# Patient Record
Sex: Male | Born: 2000 | Hispanic: No | Marital: Single | Smoking: Never smoker
Health system: Southern US, Community
[De-identification: ages and names within clinical notes are randomized; demographics above are authoritative.]

## PROBLEM LIST (undated history)

## (undated) DIAGNOSIS — E559 Vitamin D deficiency, unspecified: Secondary | ICD-10-CM

## (undated) DIAGNOSIS — E039 Hypothyroidism, unspecified: Secondary | ICD-10-CM

## (undated) DIAGNOSIS — E781 Pure hyperglyceridemia: Secondary | ICD-10-CM

---
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
## 2021-03-29 ENCOUNTER — Encounter (HOSPITAL_COMMUNITY): Payer: Self-pay | Admitting: Emergency Medicine

## 2021-03-29 ENCOUNTER — Emergency Department (HOSPITAL_COMMUNITY)
Admission: EM | Admit: 2021-03-29 | Discharge: 2021-03-29 | Disposition: A | Discharge status: Home / Self Care | Payer: Medicaid Other | Attending: Emergency Medicine | Admitting: Emergency Medicine

## 2021-03-29 ENCOUNTER — Other Ambulatory Visit: Payer: Self-pay

## 2021-03-29 DIAGNOSIS — H66001 Acute suppurative otitis media without spontaneous rupture of ear drum, right ear: Secondary | ICD-10-CM | POA: Insufficient documentation

## 2021-03-29 DIAGNOSIS — E039 Hypothyroidism, unspecified: Secondary | ICD-10-CM | POA: Insufficient documentation

## 2021-03-29 DIAGNOSIS — Z79899 Other long term (current) drug therapy: Secondary | ICD-10-CM | POA: Diagnosis not present

## 2021-03-29 DIAGNOSIS — H9201 Otalgia, right ear: Secondary | ICD-10-CM | POA: Diagnosis present

## 2021-03-29 MED ORDER — AMOXICILLIN 500 MG PO CAPS: 500.0000 mg | ORAL_CAPSULE | Freq: Three times a day (TID) | ORAL | 0 refills | Status: DC

## 2021-03-29 MED ORDER — OXYCODONE-ACETAMINOPHEN 5-325 MG PO TABS
1.0000 | ORAL_TABLET | Freq: Once | ORAL | Status: AC
  Administered 2021-03-29: 1 via ORAL
  Filled 2021-03-29: qty 1

## 2021-03-29 MED ORDER — AMOXICILLIN 500 MG PO CAPS
1000.0000 mg | ORAL_CAPSULE | Freq: Once | ORAL | Status: AC
  Administered 2021-03-29: 1000 mg via ORAL
  Filled 2021-03-29: qty 2

## 2021-03-29 NOTE — ED Provider Notes (Signed)
[facility] EMERGENCY DEPARTMENT Provider Note   CSN: [id number] Arrival date & time: 03/29/21  0308     History Chief Complaint  Patient presents with   Otalgia    Gary Maynard is a 20 y.o. male presents to the emergency department with complaints of right ear pain.  Patient states he saw his primary care yesterday and they cleaned wax from both ears.  He reports he felt fine at that time but several hours ago he developed severe right ear pain.  Has taken ibuprofen 600 mg without relief.  Denies fevers or chills.  Denies nasal congestion or cough.  No specific aggravating or alleviating factors.  The history is provided by the patient. No language interpreter was used.      History reviewed. No pertinent past medical history.  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 No pertinent surgical history.     Family History  Problem Relation Age of Onset   Diabetes Father    Kidney disease Father    Diabetes Maternal Grandmother    Diabetes Maternal Grandfather     Social History   Tobacco Use   Smoking status: Never   Smokeless tobacco: Never  Substance Use Topics   Alcohol use: Never   Drug use: Never    Home Medications Prior to Admission medications   Medication Sig Start Date End Date Taking? Authorizing Provider  amoxicillin (AMOXIL) 500 MG capsule Take 1 capsule (500 mg total) by mouth 3 (three) times daily. 03/29/21  Yes Basha Krygier, Dahlia Client, PA-C  ergocalciferol (VITAMIN D2) 50000 units capsule Take 1  capsule (50,000 Units total) by mouth once a week. 03/18/17   Dessa Phi, MD  levothyroxine (SYNTHROID, LEVOTHROID) 100 MCG tablet Take 1 tablet (100 mcg total) by mouth daily. 01/12/17 01/12/18  Dessa Phi, MD  Omega-3 Fatty Acids (FISH OIL) 1000 MG CAPS Take 1 capsule (1,000 mg total) by mouth 2 (two) times daily. 09/03/16   Dessa Phi, MD    Allergies    Patient has no known allergies.  Review of Systems   Review of Systems  Constitutional:  Negative for chills and fever.  HENT:  Positive for congestion and ear pain.   Eyes:  Negative for visual disturbance.  Respiratory:  Negative for cough and shortness of breath.   Cardiovascular:  Negative for chest pain.  Gastrointestinal:  Negative for abdominal pain.   Physical Exam Updated Vital Signs BP (!) 122/97   Pulse 90   Temp 98.1 F (36.7 C) (Oral)   Resp 18   SpO2 99%   Physical Exam Vitals and nursing note reviewed.  Constitutional:      General: He is not in acute distress.    Appearance: He is well-developed. He is not ill-appearing.  HENT:     Head: Normocephalic.     Jaw: There is normal jaw occlusion.     Right Ear: No middle ear effusion. Tympanic membrane is not injected, erythematous, retracted or bulging.     Left Ear: A middle ear effusion is present. Tympanic membrane is injected, erythematous and bulging.     Nose:  Nose normal. No congestion or rhinorrhea.     Mouth/Throat:     Mouth: Mucous membranes are moist.     Tongue: No lesions.     Palate: No mass.     Pharynx: Oropharynx is clear.  Eyes:     General: No scleral icterus.    Conjunctiva/sclera: Conjunctivae normal.  Cardiovascular:     Rate and Rhythm: Normal rate.  Pulmonary:     Effort: Pulmonary effort is normal.  Abdominal:     General: There is no distension.  Musculoskeletal:        General: Normal range of motion.     Cervical back: Normal range of motion.  Skin:    General: Skin is warm and dry.  Neurological:     Mental  Status: He is alert.  Psychiatric:        Mood and Affect: Mood normal.    ED Results / Procedures / Treatments   Labs (all labs ordered are listed, but only abnormal results are displayed) Labs Reviewed - No data to display  EKG None  Radiology No results found.  Procedures Procedures   Medications Ordered in ED Medications  amoxicillin (AMOXIL) capsule 1,000 mg (1,000 mg Oral Given 03/29/21 0327)  oxyCODONE-acetaminophen (PERCOCET/ROXICET) 5-325 MG per tablet 1 tablet (1 tablet Oral Given 03/29/21 0328)    ED Course  I have reviewed the triage vital signs and the nursing notes.  Pertinent labs & imaging results that were available during my care of the patient were reviewed by me and considered in my medical decision making (see chart for details).    MDM Rules/Calculators/A&P                           Patient presents with otalgia and exam consistent with acute otitis media. No concern for acute mastoiditis, meningitis.  No antibiotic use in the last month.  Patient discharged home with Amoxicillin.  Advised parents to call pediatrician today for follow-up.  I have also discussed reasons to return immediately to the ER.  Parent expresses understanding and agrees with plan.   Final Clinical Impression(s) / ED Diagnoses Final diagnoses:  Acute suppurative otitis media of right ear without spontaneous rupture of tympanic membrane, recurrence not specified    Rx / DC Orders ED Discharge Orders          Ordered    amoxicillin (AMOXIL) 500 MG capsule  3 times daily        03/29/21 0326             Katena Petitjean, Dahlia Client, PA-C 03/29/21 0339    Palumbo, April, MD 03/29/21 9179

## 2021-03-29 NOTE — ED Triage Notes (Signed)
Patient right ear pain onset yesterday , patient stated his MD cleaned it yesterday , no hearting loss or drainage .

## 2021-03-29 NOTE — Discharge Instructions (Addendum)
1. Medications: amoxicillin, usual home medications 2. Treatment: rest, drink plenty of fluids, do not stick anything in your ear 3. Follow Up: Please followup with your primary doctor in 2-3 days for discussion of your diagnoses and further evaluation after today's visit; if you do not have a primary care doctor use the resource guide provided to find one; Please return to the ER for new or worsening symptoms

## 2021-04-14 ENCOUNTER — Other Ambulatory Visit: Payer: Self-pay

## 2021-04-14 ENCOUNTER — Emergency Department (HOSPITAL_COMMUNITY): Payer: Medicaid Other

## 2021-04-14 ENCOUNTER — Encounter (HOSPITAL_COMMUNITY): Payer: Self-pay

## 2021-04-14 ENCOUNTER — Emergency Department (HOSPITAL_COMMUNITY)
Admission: EM | Admit: 2021-04-14 | Discharge: 2021-04-14 | Disposition: A | Discharge status: Home / Self Care | Payer: Medicaid Other | Attending: Emergency Medicine | Admitting: Emergency Medicine

## 2021-04-14 DIAGNOSIS — E039 Hypothyroidism, unspecified: Secondary | ICD-10-CM | POA: Diagnosis not present

## 2021-04-14 DIAGNOSIS — R519 Headache, unspecified: Secondary | ICD-10-CM | POA: Insufficient documentation

## 2021-04-14 DIAGNOSIS — Z79899 Other long term (current) drug therapy: Secondary | ICD-10-CM | POA: Diagnosis not present

## 2021-04-14 LAB — BASIC METABOLIC PANEL
Anion gap: 8 (ref 5–15)
BUN: 14 mg/dL (ref 6–20)
CO2: 26 mmol/L (ref 22–32)
Calcium: 9.5 mg/dL (ref 8.9–10.3)
Chloride: 107 mmol/L (ref 98–111)
Creatinine, Ser: 0.89 mg/dL (ref 0.61–1.24)
GFR, Estimated: >60 mL/min (ref >60)
Glucose, Bld: 108 mg/dL — ABNORMAL HIGH (ref 70–99)
Potassium: 4.6 mmol/L (ref 3.5–5.1)
Sodium: 141 mmol/L (ref 135–145)

## 2021-04-14 LAB — CBC WITH DIFFERENTIAL/PLATELET
Abs Immature Granulocytes: 0.02 10*3/uL (ref 0.00–0.07)
Basophils Absolute: 0 10*3/uL (ref 0.0–0.1)
Basophils Relative: 1 %
Eosinophils Absolute: 0.2 10*3/uL (ref 0.0–0.5)
Eosinophils Relative: 2 %
HCT: 41.5 % (ref 39.0–52.0)
Hemoglobin: 13.4 g/dL (ref 13.0–17.0)
Immature Granulocytes: 0 %
Lymphocytes Relative: 23 %
Lymphs Abs: 1.9 10*3/uL (ref 0.7–4.0)
MCH: 27.7 pg (ref 26.0–34.0)
MCHC: 32.3 g/dL (ref 30.0–36.0)
MCV: 85.7 fL (ref 80.0–100.0)
Monocytes Absolute: 0.4 10*3/uL (ref 0.1–1.0)
Monocytes Relative: 5 %
Neutro Abs: 5.6 10*3/uL (ref 1.7–7.7)
Neutrophils Relative %: 69 %
Platelets: 484 10*3/uL — ABNORMAL HIGH (ref 150–400)
RBC: 4.84 MIL/uL (ref 4.22–5.81)
RDW: 15.9 % — ABNORMAL HIGH (ref 11.5–15.5)
WBC: 8.1 10*3/uL (ref 4.0–10.5)
nRBC: 0 % (ref 0.0–0.2)

## 2021-04-14 NOTE — ED Provider Notes (Signed)
[facility] EMERGENCY DEPARTMENT Provider Note   CSN: [id number] Arrival date & time: 04/14/21  0814     History No chief complaint on file.   Levelle Edelen is a 20 y.o. male.  HPI Patient is a 20 year old male with a medical history as noted below.  He presents to the emergency department due to intermittent headaches for the past month.  States his headaches started along the occiput and radiate to the bilateral temporal regions.  States his pain is sharp/throbbing.  States this pain typically occurs daily in the morning and he will take ibuprofen which resolve his symptoms.  Of note, he was diagnosed with a right-sided otitis media on September 23 and discharged on amoxicillin which he states he did not complete "due to it making him feel weird".  He denies any visual changes, numbness, weakness, vomiting.    History reviewed. No pertinent past medical history.  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 No pertinent surgical history.     Family History  Problem Relation Age of Onset   Diabetes Father    Kidney disease Father    Diabetes Maternal Grandmother    Diabetes Maternal Grandfather     Social History   Tobacco Use   Smoking status: Never   Smokeless tobacco: Never  Substance Use Topics   Alcohol use: Never   Drug use: Never    Home Medications Prior to Admission medications   Medication Sig Start Date End Date Taking? Authorizing Provider  amoxicillin (AMOXIL) 500 MG capsule Take 1 capsule (500 mg  total) by mouth 3 (three) times daily. 03/29/21   Muthersbaugh, Dahlia Client, PA-C  ergocalciferol (VITAMIN D2) 50000 units capsule Take 1 capsule (50,000 Units total) by mouth once a week. 03/18/17   Dessa Phi, MD  levothyroxine (SYNTHROID, LEVOTHROID) 100 MCG tablet Take 1 tablet (100 mcg total) by mouth daily. 01/12/17 01/12/18  Dessa Phi, MD  Omega-3 Fatty Acids (FISH OIL) 1000 MG CAPS Take 1 capsule (1,000 mg total) by mouth 2 (two) times daily. 09/03/16   Dessa Phi, MD    Allergies    Patient has no known allergies.  Review of Systems   Review of Systems  All other systems reviewed and are negative. Ten systems reviewed and are negative for acute change, except as noted in the HPI.   Physical Exam Updated Vital Signs BP 121/70   Pulse (!) 59   Temp 98.4 F (36.9 C) (Oral)   Resp 18   SpO2 96%   Physical Exam Vitals and nursing note reviewed.  Constitutional:      General: He is not in acute distress.    Appearance: Normal appearance. He is normal weight. He is not ill-appearing, toxic-appearing or diaphoretic.  HENT:     Head: Normocephalic and atraumatic.     Right Ear: External ear normal. There is no impacted cerumen.     Left Ear: Tympanic membrane, ear canal and external ear normal. There is no impacted cerumen.     Ears:     Comments: Left ear, EAC, and TM appear normal.  Right external ear  appears normal.  Right EAC is erythematous.  Middle ear effusion noted on the right side.  Good bony landmarks and cone of light.  TM is nonbulging.  No tenderness appreciated with manipulation of the tragus.    Nose: Nose normal.     Mouth/Throat:     Mouth: Mucous membranes are moist.     Pharynx: Oropharynx is clear. No oropharyngeal exudate or posterior oropharyngeal erythema.  Eyes:     General: No scleral icterus.       Right eye: No discharge.        Left eye: No discharge.     Extraocular Movements: Extraocular movements intact.     Conjunctiva/sclera:  Conjunctivae normal.     Pupils: Pupils are equal, round, and reactive to light.     Comments: PERRL. EOMI.  Cardiovascular:     Rate and Rhythm: Normal rate and regular rhythm.     Pulses: Normal pulses.     Heart sounds: Normal heart sounds. No murmur heard.   No friction rub. No gallop.  Pulmonary:     Effort: Pulmonary effort is normal. No respiratory distress.     Breath sounds: Normal breath sounds. No stridor. No wheezing, rhonchi or rales.  Abdominal:     General: Abdomen is flat.     Tenderness: There is no abdominal tenderness.  Musculoskeletal:        General: Normal range of motion.     Cervical back: Normal range of motion and neck supple. No tenderness.  Skin:    General: Skin is warm and dry.  Neurological:     General: No focal deficit present.     Mental Status: He is alert and oriented to person, place, and time.     Comments: Patient is oriented to person, place, and time. Patient phonates in clear, complete, and coherent sentences. Negative arm drift. Finger to nose intact bilaterally with no visible signs of dysmetria. Strength is 5/5 in all four extremities. Distal sensation intact in all four extremities.  Psychiatric:        Mood and Affect: Mood normal.        Behavior: Behavior normal.   ED Results / Procedures / Treatments   Labs (all labs ordered are listed, but only abnormal results are displayed) Labs Reviewed  BASIC METABOLIC PANEL - Abnormal; Notable for the following components:      Result Value   Glucose, Bld 108 (*)    All other components within normal limits  CBC WITH DIFFERENTIAL/PLATELET - Abnormal; Notable for the following components:   RDW 15.9 (*)    Platelets 484 (*)    All other components within normal limits    EKG None  Radiology CT Head Wo Contrast  Result Date: 04/14/2021 CLINICAL DATA:  One week of headaches. EXAM: CT HEAD WITHOUT CONTRAST TECHNIQUE: Contiguous axial images were obtained from the base of the skull  through the vertex without intravenous contrast. COMPARISON:  None. FINDINGS: Brain: No evidence of acute infarction, hemorrhage, hydrocephalus, extra-axial collection or mass lesion/mass effect. Vascular: No hyperdense vessel is noted. Skull: Normal. Negative for fracture or focal lesion. Sinuses/Orbits: No acute finding. Other: None. IMPRESSION: No focal acute intracranial abnormality identified. Electronically Signed   By: Sherian Rein M.D.   On: 04/14/2021 08:52    Procedures Procedures   Medications Ordered in ED Medications - No data to display  ED Course  I have reviewed the triage vital signs and the nursing notes.  Pertinent labs & imaging  results that were available during my care of the patient were reviewed by me and considered in my medical decision making (see chart for details).    MDM Rules/Calculators/A&P                          Pt is a 20 y.o. male who presents to the emergency department due to intermittent headaches.  Labs: CBC with an RDW of 15.9 and platelets 44. BMP with a glucose of 108.  Imaging: CT scan of the head without contrast shows no focal acute intracranial abnormality.  I, Placido Sou, PA-C, personally reviewed and evaluated these images and lab results as part of my medical decision-making.  Unsure the source of the patient's headache.  He was recently diagnosed with a right ear infection and did not complete his antibiotics.  He does note that he is having no right ear pain at this time.  He does have a right ear effusion and mildly erythematous right EAC but right TM is not bulging.  Patient offered an additional course of antibiotics but he declined.  Physical exam is otherwise reassuring.  Neurological exam is benign.  Patient afebrile and not tachycardic.  Patient offered a migraine cocktail but he also declined.  Feel that he is stable for discharge at this time and he is agreeable.  We will give patient referral to neurology if he finds  that his symptoms are refractory.  We discussed return precautions.  His questions were answered and he was amicable at the time of discharge.  Note: Portions of this report may have been transcribed using voice recognition software. Every effort was made to ensure accuracy; however, inadvertent computerized transcription errors may be present.   Final Clinical Impression(s) / ED Diagnoses Final diagnoses:  Bad headache   Rx / DC Orders ED Discharge Orders     None        Placido Sou, PA-C 04/14/21 1532    Wynetta Fines, MD 04/15/21 2329

## 2021-04-14 NOTE — Discharge Instructions (Addendum)
I have given you a referral to [facility] neurology.  Their contact information is below.  Please give them a call and schedule a follow-up appointment regarding your headaches.  If you develop any new or worsening symptoms please come back to the emergency department.  It was a pleasure to meet you.

## 2021-04-14 NOTE — ED Provider Notes (Signed)
Emergency Medicine Provider Triage Evaluation Note  Gary Maynard , a 20 y.o. male  was evaluated in triage.  Pt complains of worsening headache x 1 week located behind head and radiating to front bilaterally. Patient rates headache 8/10 in severity, pulsatile in nature, waxes & wanes, with nausea and ringing in Left ear. Denies history of migraines in the past, CP, LOC.   Review of Systems  Positive: Nausea, sob, Left tinnitus, occasional dizziness  Negative: Vomiting, chest pain, LOC, confusion, visual changes  Physical Exam  There were no vitals taken for this visit. Gen:   Awake, no distress   Resp:  Normal effort  MSK:   Moves extremities without difficulty  Other:  Normal gait  Medical Decision Making  Medically screening exam initiated at 7:36 AM.  Appropriate orders placed.  Hakop Hernandez-Escamilla was informed that the remainder of the evaluation will be completed by another provider, this initial triage assessment does not replace that evaluation, and the importance of remaining in the ED until their evaluation is complete.     Jeannie Fend, PA-C 04/14/21 0750    Ernie Avena, MD 04/14/21 [phone]

## 2021-04-14 NOTE — ED Triage Notes (Signed)
Patient complains of 1 week of headache, pain radiating to frontal and occipital pain. Denies trauma. Patient alert and oriented, describes ringing to right ear

## 2021-09-19 ENCOUNTER — Emergency Department (HOSPITAL_COMMUNITY): Payer: Medicaid Other

## 2021-09-19 ENCOUNTER — Encounter (HOSPITAL_COMMUNITY): Payer: Self-pay | Admitting: Emergency Medicine

## 2021-09-19 ENCOUNTER — Emergency Department (HOSPITAL_COMMUNITY)
Admission: EM | Admit: 2021-09-19 | Discharge: 2021-09-19 | Disposition: A | Discharge status: Home / Self Care | Payer: Medicaid Other | Attending: Emergency Medicine | Admitting: Emergency Medicine

## 2021-09-19 ENCOUNTER — Other Ambulatory Visit: Payer: Self-pay

## 2021-09-19 DIAGNOSIS — M25522 Pain in left elbow: Secondary | ICD-10-CM | POA: Insufficient documentation

## 2021-09-19 DIAGNOSIS — Y92410 Unspecified street and highway as the place of occurrence of the external cause: Secondary | ICD-10-CM | POA: Diagnosis not present

## 2021-09-19 MED ORDER — IBUPROFEN 400 MG PO TABS
600.0000 mg | ORAL_TABLET | Freq: Once | ORAL | Status: AC
  Administered 2021-09-19: 600 mg via ORAL
  Filled 2021-09-19: qty 1

## 2021-09-19 NOTE — ED Notes (Signed)
RN reviewed discharge instructions w/ pt. Follow up and pain management reviewed, pt had no further questions. 
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
## 2021-11-16 ENCOUNTER — Emergency Department (HOSPITAL_COMMUNITY)
Admission: EM | Admit: 2021-11-16 | Discharge: 2021-11-16 | Disposition: A | Discharge status: Home / Self Care | Payer: Medicaid Other | Attending: Emergency Medicine | Admitting: Emergency Medicine

## 2021-11-16 ENCOUNTER — Encounter (HOSPITAL_COMMUNITY): Payer: Self-pay | Admitting: Emergency Medicine

## 2021-11-16 DIAGNOSIS — E860 Dehydration: Secondary | ICD-10-CM | POA: Insufficient documentation

## 2021-11-16 DIAGNOSIS — R55 Syncope and collapse: Secondary | ICD-10-CM | POA: Diagnosis present

## 2021-11-16 HISTORY — DX: Hypothyroidism, unspecified: E03.9

## 2021-11-16 HISTORY — DX: Pure hyperglyceridemia: E78.1

## 2021-11-16 HISTORY — DX: Morbid (severe) obesity due to excess calories: E66.01

## 2021-11-16 HISTORY — DX: Vitamin D deficiency, unspecified: E55.9

## 2021-11-16 LAB — URINALYSIS, ROUTINE W REFLEX MICROSCOPIC
Bacteria, UA: NONE SEEN
Bilirubin Urine: NEGATIVE
Glucose, UA: NEGATIVE mg/dL
Hgb urine dipstick: NEGATIVE
Ketones, ur: 5 mg/dL — AB
Leukocytes,Ua: NEGATIVE
Nitrite: NEGATIVE
Protein, ur: 30 mg/dL — AB
Specific Gravity, Urine: 1.03 (ref 1.005–1.030)
pH: 5 (ref 5.0–8.0)

## 2021-11-16 LAB — CBC WITH DIFFERENTIAL/PLATELET
Abs Immature Granulocytes: 0.07 10*3/uL (ref 0.00–0.07)
Basophils Absolute: 0 10*3/uL (ref 0.0–0.1)
Basophils Relative: 0 %
Eosinophils Absolute: 0.2 10*3/uL (ref 0.0–0.5)
Eosinophils Relative: 1 %
HCT: 45 % (ref 39.0–52.0)
Hemoglobin: 15.2 g/dL (ref 13.0–17.0)
Immature Granulocytes: 0 %
Lymphocytes Relative: 4 %
Lymphs Abs: 0.8 10*3/uL (ref 0.7–4.0)
MCH: 29 pg (ref 26.0–34.0)
MCHC: 33.8 g/dL (ref 30.0–36.0)
MCV: 85.7 fL (ref 80.0–100.0)
Monocytes Absolute: 1 10*3/uL (ref 0.1–1.0)
Monocytes Relative: 5 %
Neutro Abs: 16.2 10*3/uL — ABNORMAL HIGH (ref 1.7–7.7)
Neutrophils Relative %: 90 %
Platelets: 367 10*3/uL (ref 150–400)
RBC: 5.25 MIL/uL (ref 4.22–5.81)
RDW: 15.1 % (ref 11.5–15.5)
WBC: 18.2 10*3/uL — ABNORMAL HIGH (ref 4.0–10.5)
nRBC: 0 % (ref 0.0–0.2)

## 2021-11-16 LAB — LIPASE, BLOOD: Lipase: 32 U/L (ref 11–51)

## 2021-11-16 LAB — HEPATIC FUNCTION PANEL
ALT: 17 U/L (ref 0–44)
AST: 20 U/L (ref 15–41)
Albumin: 4.4 g/dL (ref 3.5–5.0)
Alkaline Phosphatase: 64 U/L (ref 38–126)
Bilirubin, Direct: 0.1 mg/dL (ref 0.0–0.2)
Indirect Bilirubin: 0.9 mg/dL (ref 0.3–0.9)
Total Bilirubin: 1 mg/dL (ref 0.3–1.2)
Total Protein: 7.7 g/dL (ref 6.5–8.1)

## 2021-11-16 LAB — BASIC METABOLIC PANEL
Anion gap: 9 (ref 5–15)
BUN: 19 mg/dL (ref 6–20)
CO2: 22 mmol/L (ref 22–32)
Calcium: 9.4 mg/dL (ref 8.9–10.3)
Chloride: 108 mmol/L (ref 98–111)
Creatinine, Ser: 0.85 mg/dL (ref 0.61–1.24)
GFR, Estimated: >60 mL/min (ref >60)
Glucose, Bld: 111 mg/dL — ABNORMAL HIGH (ref 70–99)
Potassium: 4 mmol/L (ref 3.5–5.1)
Sodium: 139 mmol/L (ref 135–145)

## 2021-11-16 LAB — CBG MONITORING, ED: Glucose-Capillary: 124 mg/dL — ABNORMAL HIGH (ref 70–99)

## 2021-11-16 MED ORDER — SODIUM CHLORIDE 0.9 % IV BOLUS
1000.0000 mL | Freq: Once | INTRAVENOUS | Status: AC
  Administered 2021-11-16: 1000 mL via INTRAVENOUS

## 2021-11-16 MED ORDER — METOCLOPRAMIDE HCL 5 MG/ML IJ SOLN
5.0000 mg | Freq: Once | INTRAMUSCULAR | Status: AC
Start: 2021-11-16 — End: 2021-11-16
  Administered 2021-11-16: 5 mg via INTRAVENOUS
  Filled 2021-11-16: qty 2

## 2021-11-16 NOTE — ED Triage Notes (Addendum)
Per EMS, patient from work, c/o syncopal episode on the way to bathroom. Reports having diarrhea today. BP 90/64 initially. Last BP 124/68. ?

## 2021-11-16 NOTE — ED Provider Notes (Addendum)
?[facility]-EMERGENCY DEPT ?Provider Note ? ? ?CSN: [id number] ?Arrival date & time: 11/16/21  1312 ? ?  ? ?History ? ?Chief Complaint  ?Patient presents with  ? Loss of Consciousness  ? ? ?Gary Maynard is a 21 y.o. male. ? ?21 year old male who presents after having syncopal event.  Patient states that he had nonbilious emesis followed by watery diarrhea.  No associated abdominal pain.  No fever.  States he got dizzy and lightheaded.  No associated chest pain.  Had brief syncopal event.  No reported seizure activity.  Feels better at this time. ? ? ?  ? ?Home Medications ?Prior to Admission medications   ?Medication Sig Start Date End Date Taking? Authorizing Provider  ?amoxicillin (AMOXIL) 500 MG capsule Take 1 capsule (500 mg total) by mouth 3 (three) times daily. 03/29/21   Muthersbaugh, Dahlia Client, PA-C  ?ergocalciferol (VITAMIN D2) 50000 units capsule Take 1 capsule (50,000 Units total) by mouth once a week. 03/18/17   Dessa Phi, MD  ?levothyroxine (SYNTHROID, LEVOTHROID) 100 MCG tablet Take 1 tablet (100 mcg total) by mouth daily. 01/12/17 01/12/18  Dessa Phi, MD  ?Omega-3 Fatty Acids (FISH OIL) 1000 MG CAPS Take 1 capsule (1,000 mg total) by mouth 2 (two) times daily. 09/03/16   Dessa Phi, MD  ?   ? ?Allergies    ?Patient has no known allergies.   ? ?Review of Systems   ?Review of Systems  ?All other systems reviewed and are negative. ? ?Physical Exam ?Updated Vital Signs ?BP 124/71 (BP Location: Left Arm)   Pulse 84   Temp 97.9 ?F (36.6 ?C) (Oral)   Resp (!) 24   Ht 1.829 m (6')   Wt 105 kg   SpO2 99%   BMI 31.39 kg/m?  ?Physical Exam ?Vitals and nursing note reviewed.  ?Constitutional:   ?   General: He is not in acute distress. ?   Appearance: Normal appearance. He is well-developed. He is not toxic-appearing.  ?HENT:  ?   Head: Normocephalic and atraumatic.  ?Eyes:  ?   General: Lids are normal.  ?   Conjunctiva/sclera: Conjunctivae normal.  ?   Pupils:  Pupils are equal, round, and reactive to light.  ?Neck:  ?   Thyroid: No thyroid mass.  ?   Trachea: No tracheal deviation.  ?Cardiovascular:  ?   Rate and Rhythm: Normal rate and regular rhythm.  ?   Heart sounds: Normal heart sounds. No murmur heard. ?  No gallop.  ?Pulmonary:  ?   Effort: Pulmonary effort is normal. No respiratory distress.  ?   Breath sounds: Normal breath sounds. No stridor. No decreased breath sounds, wheezing, rhonchi or rales.  ?Abdominal:  ?   General: There is no distension.  ?   Palpations: Abdomen is soft.  ?   Tenderness: There is no abdominal tenderness. There is no rebound.  ?Musculoskeletal:     ?   General: No tenderness. Normal range of motion.  ?   Cervical back: Normal range of motion and neck supple.  ?Skin: ?   General: Skin is warm and dry.  ?   Findings: No abrasion or rash.  ?Neurological:  ?   Mental Status: He is alert and oriented to person, place, and time. Mental status is at baseline.  ?   GCS: GCS eye subscore is 4. GCS verbal subscore is 5. GCS motor subscore is 6.  ?   Cranial Nerves: No cranial nerve deficit.  ?   Sensory:  No sensory deficit.  ?   Motor: Motor function is intact.  ?Psychiatric:     ?   Attention and Perception: Attention normal.     ?   Speech: Speech normal.     ?   Behavior: Behavior normal.  ? ? ?ED Results / Procedures / Treatments   ?Labs ?(all labs ordered are listed, but only abnormal results are displayed) ?Labs Reviewed  ?BASIC METABOLIC PANEL - Abnormal; Notable for the following components:  ?    Result Value  ? Glucose, Bld 111 (*)   ? All other components within normal limits  ?CBC WITH DIFFERENTIAL/PLATELET - Abnormal; Notable for the following components:  ? WBC 18.2 (*)   ? Neutro Abs 16.2 (*)   ? All other components within normal limits  ?CBG MONITORING, ED - Abnormal; Notable for the following components:  ? Glucose-Capillary 124 (*)   ? All other components within normal limits  ?HEPATIC FUNCTION PANEL  ?LIPASE, BLOOD   ?URINALYSIS, ROUTINE W REFLEX MICROSCOPIC  ?TSH  ?CBG MONITORING, ED  ?CBG MONITORING, ED  ? ? ?EKG ?EKG Interpretation ? ?Date/Time:  Saturday Nov 16 2021 13:36:29 EDT ?Ventricular Rate:  95 ?PR Interval:  156 ?QRS Duration: 108 ?QT Interval:  338 ?QTC Calculation: 424 ?R Axis:   79 ?Text Interpretation: Normal sinus rhythm Incomplete right bundle branch block Borderline ECG When compared with ECG of 14-Apr-2021 15:07, PREVIOUS ECG IS PRESENT Confirmed by Lorre Nick ([id number]) on 11/16/2021 6:04:55 PM ? ?Radiology ?No results found. ? ?Procedures ?Procedures  ? ? ?Medications Ordered in ED ?Medications  ?sodium chloride 0.9 % bolus 1,000 mL (has no administration in time range)  ?metoCLOPramide (REGLAN) injection 5 mg (has no administration in time range)  ?sodium chloride 0.9 % bolus 1,000 mL (1,000 mLs Intravenous New Bag/Given 11/16/21 1806)  ? ? ?ED Course/ Medical Decision Making/ A&P ?Clinical Course as of 11/16/21 1812  ?Sat Nov 16, 2021  ?1458 WBC(!): 18.2 [GL]  ?  ?Clinical Course User Index ?[GL] Loeffler, Finis Bud, PA-C  ? ?                        ?Medical Decision Making ?Amount and/or Complexity of Data Reviewed ?Labs: ordered. ? ?Risk ?Prescription drug management. ? ? ?Patient presents with what sounds like a viral gastroenteritis.  He has no abdominal discomfort.  Considered intra-abdominal pathologies but feel less likely.  Patient's urinalysis does show slightly increased ketones.  Patient slightly dehydrated and given IV fluids here as well as Reglan for his headache and feels much better.  He has not had any cough or shortness of breath. ? ?Patient likely had a vasovagal episode.  Patient feels better after treatment here will be discharged home.  Does not require admission at this time.  The patient did not require a translator for this encounter ? ? ? ? ? ? ? ?Final Clinical Impression(s) / ED Diagnoses ?Final diagnoses:  ?None  ? ? ?Rx / DC Orders ?ED Discharge Orders   ? ? None  ? ?  ? ? ?   ?Lorre Nick, MD ?11/16/21 1938 ? ?  ?Lorre Nick, MD ?11/16/21 1939 ? ?

## 2021-11-16 NOTE — ED Provider Triage Note (Signed)
Emergency Medicine Provider Triage Evaluation Note ? ?Gary Maynard , a 21 y.o. male  was evaluated in triage.  Pt complains of syncope.  Patient states that today he started not feeling well.  He was at work when he started feeling suddenly nauseous.  He went to go to the break room to lie down for a second and while he was walking he says that he thinks he passed out.  He does not remember being dizzy or anything like that when he passed out but he was feeling nauseous.  Nobody witnessed this event.  Does not think he has any injuries from the syncopal event.Gary Maynard  He also states that he has been having multiple episodes of diarrhea today.  He does have some abdominal discomfort in the epigastric region that feels aching.  He is concerned that he started a new medication last week called Synthroid.  He thinks that this may be causing his symptoms. ? ?Review of Systems  ?Positive: Syncope, nausea, diarrhea ?Negative:  ? ?Physical Exam  ?BP 133/63 (BP Location: Left Arm)   Pulse (!) 101   Temp 97.9 ?F (36.6 ?C) (Oral)   Resp 16   Ht 6' (1.829 m)   Wt 105 kg   SpO2 97%   BMI 31.39 kg/m?  ?Gen:   Awake, no distress   ?Resp:  Normal effort  ?MSK:   Moves extremities without difficulty  ?Other:   ? ?Medical Decision Making  ?Medically screening exam initiated at 1:50 PM.  Appropriate orders placed.  Gary Maynard was informed that the remainder of the evaluation will be completed by another provider, this initial triage assessment does not replace that evaluation, and the importance of remaining in the ED until their evaluation is complete. ? ? ?  ?Claudie Leach, PA-C ?11/16/21 1352 ? ?

## 2021-11-26 ENCOUNTER — Other Ambulatory Visit: Payer: Self-pay | Admitting: Nurse Practitioner

## 2021-11-26 DIAGNOSIS — R3129 Other microscopic hematuria: Secondary | ICD-10-CM

## 2021-11-29 ENCOUNTER — Ambulatory Visit
Admission: RE | Admit: 2021-11-29 | Discharge: 2021-11-29 | Disposition: A | Discharge status: Home / Self Care | Payer: Medicaid Other | Source: Ambulatory Visit | Attending: Nurse Practitioner | Admitting: Nurse Practitioner

## 2021-11-29 DIAGNOSIS — R3129 Other microscopic hematuria: Secondary | ICD-10-CM

## 2022-04-24 ENCOUNTER — Ambulatory Visit
Admission: RE | Admit: 2022-04-24 | Discharge: 2022-04-24 | Disposition: A | Discharge status: Home / Self Care | Payer: Medicaid Other | Source: Ambulatory Visit | Attending: Nurse Practitioner | Admitting: Nurse Practitioner

## 2022-04-24 ENCOUNTER — Other Ambulatory Visit: Payer: Self-pay | Admitting: Nurse Practitioner

## 2022-04-24 DIAGNOSIS — M545 Low back pain, unspecified: Secondary | ICD-10-CM

## 2022-05-08 ENCOUNTER — Ambulatory Visit: Payer: Medicaid Other | Attending: Nurse Practitioner | Admitting: Physical Therapy

## 2022-05-08 ENCOUNTER — Encounter: Payer: Self-pay | Admitting: Physical Therapy

## 2022-05-08 DIAGNOSIS — R293 Abnormal posture: Secondary | ICD-10-CM | POA: Insufficient documentation

## 2022-05-08 DIAGNOSIS — M6281 Muscle weakness (generalized): Secondary | ICD-10-CM | POA: Insufficient documentation

## 2022-05-08 DIAGNOSIS — M5459 Other low back pain: Secondary | ICD-10-CM | POA: Diagnosis present

## 2022-05-08 DIAGNOSIS — R29898 Other symptoms and signs involving the musculoskeletal system: Secondary | ICD-10-CM | POA: Insufficient documentation

## 2022-05-08 NOTE — Therapy (Signed)
OUTPATIENT PHYSICAL THERAPY THORACOLUMBAR EVALUATION   Patient Name: Gary Maynard MRN: [id number] DOB:[birth year], 21 y.o., male Today's Date: 05/08/2022   PT End of Session - 05/08/22 1412     Visit Number 1    Number of Visits 13    Date for PT Re-Evaluation 06/19/22    Authorization Type healthy blue    Authorization Time Period 05/08/22 to 07/03/22   extended in case of scheduling delay   PT Start Time 1317    PT Stop Time 1400    PT Time Calculation (min) 43 min    Activity Tolerance Patient tolerated treatment well    Behavior During Therapy [facility] for tasks assessed/performed             Past Medical History:  Diagnosis Date   Hypertriglyceridemia    Hypothyroidism    Morbid obesity ([place])    Vitamin D deficiency    History reviewed. No pertinent surgical history. 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/18/2015   Prediabetes 12/18/2015   Morbid obesity ([place]) 12/18/2015   Goiter 12/18/2015   Dyspepsia 12/18/2015   Elevated transaminase level 12/18/2015   Subclinical hypothyroidism 08/23/2015   Vitamin D deficiency 08/23/2015   Elevated hemoglobin A1c 08/23/2015   Family history of diabetic complications 78/29/5621    PCP: Dianna Rossetti NP   REFERRING PROVIDER: Dianna Rossetti, NP   REFERRING DIAG: M54.50 (ICD-10-CM) - Low back pain   Rationale for Evaluation and Treatment: Rehabilitation  THERAPY DIAG:  Other low back pain  Other symptoms and signs involving the musculoskeletal system  Muscle weakness (generalized)  Abnormal posture  ONSET DATE: 05/02/2022   SUBJECTIVE:                                                                                                                                                                                           SUBJECTIVE STATEMENT: My low back has been bothering me for awhile, one time I was doing a  workout where you put the weight on your upper back and squat down and I felt something pop in my low back. I was using 145# at the time. This was awhile ago, I tried to get different types of treatments and none worked. At first my pain got a little better but after that it stayed the same. No numbness or tingling Initially injury occurred October 2019.   PERTINENT HISTORY:   PAIN:  Are you having pain? Yes: NPRS scale: 4/10; can get to 9/10 at highest but takes maybe 10-15 minutes to calm down to 4/10 Pain location: middle  of low back and into R hip  Pain description: sometimes sharp Aggravating factors: sleeping on the couch Relieving factors: nothing   PRECAUTIONS: None  WEIGHT BEARING RESTRICTIONS: No  FALLS:  Has patient fallen in last 6 months? No  LIVING ENVIRONMENT: Lives with: lives with their family Lives in: House/apartment Stairs: 3 steps on the inside,2 steps no rails  Has following equipment at home: None  OCCUPATION: student   PLOF: Independent, Independent with basic ADLs, Independent with gait, and Independent with transfers  PATIENT GOALS: see if I can get feeling a little better    OBJECTIVE:   DIAGNOSTIC FINDINGS:  CLINICAL DATA:  Low back pain radiating to left buttock, no injury   EXAM: LUMBAR SPINE - 2-3 VIEW   COMPARISON:  08/01/2018   FINDINGS: There is no evidence of lumbar spine fracture. Alignment is normal. Disc spaces and vertebral body heights are preserved. Nonobstructive pattern of overlying bowel gas.   IMPRESSION: No fracture or dislocation of the lumbar spine. Disc spaces and vertebral body heights are preserved. Lumbar disc and neural foraminal pathology may be further evaluated by MRI if indicated by neurologically localizing signs and symptoms.  IMPRESSION: 1. No hydronephrosis. No sonographic evidence of pyelonephritis but ultrasound is not sensitive for pyelonephritis    PATIENT SURVEYS:  Oswestry 6/50  SCREENING  FOR RED FLAGS: Bowel or bladder incontinence: Yes: started 2 months ago   but later clarifies this is not true incontinence  Spinal tumors: No Cauda equina syndrome: Yes: started 2 months ago  Compression fracture: No Abdominal aneurysm: No  COGNITION: Overall cognitive status: Within functional limits for tasks assessed     SENSATION: WFL sacral dermatomes intact   MUSCLE LENGTH:  B hamstrings with severe limitation, B piriformis with severe limitation, B quads moderate limitation   POSTURE: rounded shoulders, forward head, increased thoracic kyphosis, and flexed trunk   PALPATION:  R lumbar paraspinals TTP, L glutes/piriformis TTP; lumbar PAs WNL down to L4 then very hypomobile at L5   LUMBAR ROM:   AROM eval  Flexion Moderate limitation, RFIS increases pain   Extension Mild limitation, REIS increase in pain   Right lateral flexion WNL  Left lateral flexion WNL   Right rotation Severe limitation   Left rotation Severe limitation    (Blank rows = not tested)  LOWER EXTREMITY ROM:     Active  Right eval Left eval  Hip flexion    Hip extension    Hip abduction    Hip adduction    Hip internal rotation    Hip external rotation    Knee flexion    Knee extension    Ankle dorsiflexion    Ankle plantarflexion    Ankle inversion    Ankle eversion     (Blank rows = not tested)  LOWER EXTREMITY MMT:    MMT Right eval Left eval  Hip flexion 4+ 4+  Hip extension 3+ 3+  Hip abduction 3 4+  Hip adduction    Hip internal rotation    Hip external rotation    Knee flexion 4+ 4+  Knee extension 5 5  Ankle dorsiflexion 5 5  Ankle plantarflexion    Ankle inversion    Ankle eversion     (Blank rows = not tested)  LUMBAR SPECIAL TESTS:    FUNCTIONAL TESTS:   Clonus (-)  No LLD noted   GAIT:  TODAY'S TREATMENT:  DATE:    05/08/22  TherEx  Lumbar rotation stretch 3x10 seconds B SKTC 3x10 seconds B  Figure 4 piriformis stretch 1x30 seconds B       PATIENT EDUCATION:  Education details: POC, HEP, exam findings Person educated: Patient Education method: Explanation, Verbal cues, and Handouts Education comprehension: verbalized understanding and returned demonstration  HOME EXERCISE PROGRAM: [id number]  ASSESSMENT:  CLINICAL IMPRESSION: Patient is a 21 y.o. M who was seen today for physical therapy evaluation and treatment for LBP. At first endorsed some signs/symptoms of possible cauda equina, related PT testing negative and he stated later he had misunderstood the terminology used- might benefit from medical interpreter here. Exam reveals significant limitations in lumbar and hip ROM, mild functional muscle weakness, poor biomechanics, and soft tissue impairment. Will benefit from skilled PT services to address functional impairments and reduce pain moving forward.   OBJECTIVE IMPAIRMENTS: decreased knowledge of condition, decreased mobility, difficulty walking, decreased ROM, decreased strength, hypomobility, increased fascial restrictions, increased muscle spasms, impaired flexibility, postural dysfunction, and pain.   ACTIVITY LIMITATIONS: carrying, lifting, bending, sitting, standing, squatting, sleeping, reach over head, and locomotion level  PARTICIPATION LIMITATIONS: cleaning, laundry, driving, shopping, community activity, occupation, and yard work  PERSONAL FACTORS: Age, Behavior pattern, Education, Fitness, and Past/current experiences are also affecting patient's functional outcome.   REHAB POTENTIAL: Good  CLINICAL DECISION MAKING: Stable/uncomplicated  EVALUATION COMPLEXITY: Low   GOALS: Goals reviewed with patient? No  SHORT TERM GOALS: Target date: 05/29/2022  Will be compliant with appropriate progressive HEP  Baseline: Goal status: INITIAL  2.  Pain to be no more than  6/10 at worst  Baseline:  Goal status: INITIAL  3.  Will demonstrate better understanding of general posture and biomechanics  Baseline:  Goal status: INITIAL  4.  Lumbar ROM to be no more than 25% limited on all planes  Baseline:  Goal status: INITIAL  5.  Hamstring and piriformis mm flexibility limitations to be no more than 25% impaired  Baseline:  Goal status: INITIAL    LONG TERM GOALS: Target date: 06/19/2022  MMT to improve by at least 1 grade in all weak groups  Baseline:  Goal status: INITIAL  2.  Pain to be no more than 4/10 at worst  Baseline:  Goal status: INITIAL  3.  Will demonstrate good biomechanics for weighted squats and deadlifts to prevent exacerbating condition with gym activities  Baseline:  Goal status: INITIAL  4.  Will be able to sit/stand/walk for unlimited periods of time without increase in pain to allow him to participate in all school and work based activities without increase in pain  Baseline:  Goal status: INITIAL    PLAN:  PT FREQUENCY: 2x/week  PT DURATION: 6 weeks  PLANNED INTERVENTIONS: Therapeutic exercises, Therapeutic activity, Neuromuscular re-education, Gait training, Patient/Family education, Self Care, Dry Needling, Manual therapy, and Re-evaluation.  PLAN FOR NEXT SESSION: work on lumbar and hip ROM/mobility, core strength, lifting biomechanics, manual/DN PRN    Cerita Rabelo U PT DPT PN2  05/08/2022, 2:14 PM   Check all possible CPT codes: 47096 - PT Re-evaluation, 97110- Therapeutic Exercise, [phone]- Neuro Re-education, [phone] - Gait Training, [phone] - Manual Therapy, [phone] - Therapeutic Activities, and [phone] - Self Care    Check all conditions that are expected to impact treatment: Musculoskeletal disorders and Social determinants of health   If treatment provided at initial evaluation, no treatment charged due to lack of authorization.

## 2022-05-13 ENCOUNTER — Encounter: Payer: Self-pay | Admitting: Physical Therapy

## 2022-05-13 ENCOUNTER — Ambulatory Visit: Payer: Medicaid Other | Admitting: Physical Therapy

## 2022-05-13 DIAGNOSIS — M5459 Other low back pain: Secondary | ICD-10-CM

## 2022-05-13 DIAGNOSIS — R293 Abnormal posture: Secondary | ICD-10-CM

## 2022-05-13 DIAGNOSIS — R29898 Other symptoms and signs involving the musculoskeletal system: Secondary | ICD-10-CM

## 2022-05-13 DIAGNOSIS — M6281 Muscle weakness (generalized): Secondary | ICD-10-CM

## 2022-05-13 NOTE — Therapy (Signed)
OUTPATIENT PHYSICAL THERAPY THORACOLUMBAR TREATMENT    Patient Name: Gary Maynard MRN: [id number] DOB:2000-11-27, 21 y.o., male Today's Date: 05/13/2022   PT End of Session - 05/13/22 1340     Visit Number 2    Number of Visits 13    Date for PT Re-Evaluation 06/19/22    Authorization Type healthy blue    Authorization Time Period 05/08/22 to 07/03/22    PT Start Time 1317    PT Stop Time 1355    PT Time Calculation (min) 38 min    Activity Tolerance Patient tolerated treatment well    Behavior During Therapy [facility] for tasks assessed/performed              Past Medical History:  Diagnosis Date   Hypertriglyceridemia    Hypothyroidism    Morbid obesity ([facility])    Vitamin D deficiency    History reviewed. No pertinent surgical history. 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/18/2015   Prediabetes 12/18/2015   Morbid obesity ([facility]) 12/18/2015   Goiter 12/18/2015   Dyspepsia 12/18/2015   Elevated transaminase level 12/18/2015   Subclinical hypothyroidism 08/23/2015   Vitamin D deficiency 08/23/2015   Elevated hemoglobin A1c 08/23/2015   Family history of diabetic complications 08/23/2015    PCP: Levonne Lapping NP   REFERRING PROVIDER: Levonne Lapping, NP   REFERRING DIAG: M54.50 (ICD-10-CM) - Low back pain   Rationale for Evaluation and Treatment: Rehabilitation  THERAPY DIAG:  Other low back pain  Other symptoms and signs involving the musculoskeletal system  Muscle weakness (generalized)  Abnormal posture  ONSET DATE: 05/02/2022   SUBJECTIVE:                                                                                                                                                                                           SUBJECTIVE STATEMENT:  Nothing new, I have a slight headache now back is about the same. Haven't tried exercises at home just  haven't had the time.   PERTINENT HISTORY:   PAIN:  Are you having pain? Yes: NPRS scale: 7/10 Pain location: middle of low back and into R hip  Pain description: aching and sharp  Aggravating factors: sleeping on the couch, stretching (not HEP, vague about what exact stretches make pain worse) Relieving factors: nothing    PRECAUTIONS: None  WEIGHT BEARING RESTRICTIONS: No  FALLS:  Has patient fallen in last 6 months? No  LIVING ENVIRONMENT: Lives with: lives with their family Lives in: House/apartment Stairs: 3 steps on the inside,2 steps no rails  Has following equipment  at home: None  OCCUPATION: student   PLOF: Independent, Independent with basic ADLs, Independent with gait, and Independent with transfers  PATIENT GOALS: see if I can get feeling a little better    OBJECTIVE:   DIAGNOSTIC FINDINGS:  CLINICAL DATA:  Low back pain radiating to left buttock, no injury   EXAM: LUMBAR SPINE - 2-3 VIEW   COMPARISON:  08/01/2018   FINDINGS: There is no evidence of lumbar spine fracture. Alignment is normal. Disc spaces and vertebral body heights are preserved. Nonobstructive pattern of overlying bowel gas.   IMPRESSION: No fracture or dislocation of the lumbar spine. Disc spaces and vertebral body heights are preserved. Lumbar disc and neural foraminal pathology may be further evaluated by MRI if indicated by neurologically localizing signs and symptoms.  IMPRESSION: 1. No hydronephrosis. No sonographic evidence of pyelonephritis but ultrasound is not sensitive for pyelonephritis    PATIENT SURVEYS:  Oswestry 6/50  SCREENING FOR RED FLAGS: Bowel or bladder incontinence: Yes: started 2 months ago   but later clarifies this is not true incontinence  Spinal tumors: No Cauda equina syndrome: Yes: started 2 months ago  Compression fracture: No Abdominal aneurysm: No  COGNITION: Overall cognitive status: Within functional limits for tasks  assessed     SENSATION: WFL sacral dermatomes intact   MUSCLE LENGTH:  B hamstrings with severe limitation, B piriformis with severe limitation, B quads moderate limitation   POSTURE: rounded shoulders, forward head, increased thoracic kyphosis, and flexed trunk   PALPATION:  R lumbar paraspinals TTP, L glutes/piriformis TTP; lumbar PAs WNL down to L4 then very hypomobile at L5   LUMBAR ROM:   AROM eval  Flexion Moderate limitation, RFIS increases pain   Extension Mild limitation, REIS increase in pain   Right lateral flexion WNL  Left lateral flexion WNL   Right rotation Severe limitation   Left rotation Severe limitation    (Blank rows = not tested)  LOWER EXTREMITY ROM:     Active  Right eval Left eval  Hip flexion    Hip extension    Hip abduction    Hip adduction    Hip internal rotation    Hip external rotation    Knee flexion    Knee extension    Ankle dorsiflexion    Ankle plantarflexion    Ankle inversion    Ankle eversion     (Blank rows = not tested)  LOWER EXTREMITY MMT:    MMT Right eval Left eval  Hip flexion 4+ 4+  Hip extension 3+ 3+  Hip abduction 3 4+  Hip adduction    Hip internal rotation    Hip external rotation    Knee flexion 4+ 4+  Knee extension 5 5  Ankle dorsiflexion 5 5  Ankle plantarflexion    Ankle inversion    Ankle eversion     (Blank rows = not tested)  LUMBAR SPECIAL TESTS:    FUNCTIONAL TESTS:   Clonus (-)  No LLD noted   GAIT:  TODAY'S TREATMENT:  DATE:   05/13/22  TherEx  SKTC 5x10 B  Lumbar rotation stretches x5 B 10 seconds holds  B HS stretches 3x30 seconds  B figure 4 piriformis stretches 3x30 seconds B  Bridges with hip ABD into green TB x10  Sidelying clams green TB x10 B  Walking bridges x10    05/08/22  TherEx  Lumbar rotation stretch 3x10 seconds B SKTC  3x10 seconds B  Figure 4 piriformis stretch 1x30 seconds B       PATIENT EDUCATION:  Education details: exercise form/purpose, HEP compliance  Person educated: Patient Education method: Explanation, Verbal cues, and Handouts Education comprehension: verbalized understanding and returned demonstration  HOME EXERCISE PROGRAM: [phone]  ASSESSMENT:  CLINICAL IMPRESSION:  Gary Maynard arrives today doing OK, hasn't had a chance to try his HEP just yet. Focused session on mm flexibility and core/hip strengthening today as per POC with progressions as tolerated. Proximal mm groups very weak and with poor endurance. Will plan to update HEP if compliance has improved next session.   OBJECTIVE IMPAIRMENTS: decreased knowledge of condition, decreased mobility, difficulty walking, decreased ROM, decreased strength, hypomobility, increased fascial restrictions, increased muscle spasms, impaired flexibility, postural dysfunction, and pain.   ACTIVITY LIMITATIONS: carrying, lifting, bending, sitting, standing, squatting, sleeping, reach over head, and locomotion level  PARTICIPATION LIMITATIONS: cleaning, laundry, driving, shopping, community activity, occupation, and yard work  PERSONAL FACTORS: Age, Behavior pattern, Education, Fitness, and Past/current experiences are also affecting patient's functional outcome.   REHAB POTENTIAL: Good  CLINICAL DECISION MAKING: Stable/uncomplicated  EVALUATION COMPLEXITY: Low   GOALS: Goals reviewed with patient? No  SHORT TERM GOALS: Target date: 05/29/2022  Will be compliant with appropriate progressive HEP  Baseline: Goal status: INITIAL  2.  Pain to be no more than 6/10 at worst  Baseline:  Goal status: INITIAL  3.  Will demonstrate better understanding of general posture and biomechanics  Baseline:  Goal status: INITIAL  4.  Lumbar ROM to be no more than 25% limited on all planes  Baseline:  Goal status: INITIAL  5.  Hamstring and  piriformis mm flexibility limitations to be no more than 25% impaired  Baseline:  Goal status: INITIAL    LONG TERM GOALS: Target date: 06/19/2022  MMT to improve by at least 1 grade in all weak groups  Baseline:  Goal status: INITIAL  2.  Pain to be no more than 4/10 at worst  Baseline:  Goal status: INITIAL  3.  Will demonstrate good biomechanics for weighted squats and deadlifts to prevent exacerbating condition with gym activities  Baseline:  Goal status: INITIAL  4.  Will be able to sit/stand/walk for unlimited periods of time without increase in pain to allow him to participate in all school and work based activities without increase in pain  Baseline:  Goal status: INITIAL    PLAN:  PT FREQUENCY: 2x/week  PT DURATION: 6 weeks  PLANNED INTERVENTIONS: Therapeutic exercises, Therapeutic activity, Neuromuscular re-education, Gait training, Patient/Family education, Self Care, Dry Needling, Manual therapy, and Re-evaluation.  PLAN FOR NEXT SESSION: work on lumbar and hip ROM/mobility, core strength, lifting biomechanics, manual/DN PRN    Gary Maynard U PT DPT PN2  05/13/2022, 1:56 PM   Check all possible CPT codes: 31540 - PT Re-evaluation, 97110- Therapeutic Exercise, [phone]- Neuro Re-education, [phone] - Gait Training, [phone] - Manual Therapy, [phone] - Therapeutic Activities, and [phone] - Self Care    Check all conditions that are expected to impact treatment: Musculoskeletal disorders and Social determinants of health  If treatment provided at initial evaluation, no treatment charged due to lack of authorization.

## 2022-05-20 ENCOUNTER — Telehealth: Payer: Self-pay | Admitting: Physical Therapy

## 2022-05-20 ENCOUNTER — Ambulatory Visit: Payer: Medicaid Other | Admitting: Physical Therapy

## 2022-05-20 NOTE — Telephone Encounter (Signed)
No-show for today's appointment. Called and spoke to pt, he apologizes as he forget. Reminded him of next appt date and time.  Lerry Liner PT DPT PN2

## 2022-05-27 ENCOUNTER — Encounter: Payer: Self-pay | Admitting: Physical Therapy

## 2022-05-27 ENCOUNTER — Ambulatory Visit: Payer: Medicaid Other | Admitting: Physical Therapy

## 2022-05-27 DIAGNOSIS — M5459 Other low back pain: Secondary | ICD-10-CM | POA: Diagnosis not present

## 2022-05-27 DIAGNOSIS — R29898 Other symptoms and signs involving the musculoskeletal system: Secondary | ICD-10-CM

## 2022-05-27 DIAGNOSIS — R293 Abnormal posture: Secondary | ICD-10-CM

## 2022-05-27 DIAGNOSIS — M6281 Muscle weakness (generalized): Secondary | ICD-10-CM

## 2022-05-27 NOTE — Therapy (Signed)
OUTPATIENT PHYSICAL THERAPY THORACOLUMBAR TREATMENT    Patient Name: Gary Maynard MRN: [id number] DOB:[birth year], 21 y.o., male Today's Date: 05/27/2022   PT End of Session - 05/27/22 1435     Visit Number 3    Number of Visits 13    Date for PT Re-Evaluation 06/19/22    Authorization Type healthy blue    Authorization Time Period 05/08/22 to 07/03/22    PT Start Time 1400    PT Stop Time 1441    PT Time Calculation (min) 41 min    Activity Tolerance Patient tolerated treatment well    Behavior During Therapy [facility] for tasks assessed/performed               Past Medical History:  Diagnosis Date   Hypertriglyceridemia    Hypothyroidism    Morbid obesity (HCC)    Vitamin D deficiency    History reviewed. No pertinent surgical history. 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/18/2015   Prediabetes 12/18/2015   Morbid obesity (HCC) 12/18/2015   Goiter 12/18/2015   Dyspepsia 12/18/2015   Elevated transaminase level 12/18/2015   Subclinical hypothyroidism 08/23/2015   Vitamin D deficiency 08/23/2015   Elevated hemoglobin A1c 08/23/2015   Family history of diabetic complications 08/23/2015    PCP: Levonne Lapping NP   REFERRING PROVIDER: Levonne Lapping, NP   REFERRING DIAG: M54.50 (ICD-10-CM) - Low back pain   Rationale for Evaluation and Treatment: Rehabilitation  THERAPY DIAG:  Other low back pain  Other symptoms and signs involving the musculoskeletal system  Muscle weakness (generalized)  Abnormal posture  ONSET DATE: 05/02/2022   SUBJECTIVE:                                                                                                                                                                                           SUBJECTIVE STATEMENT:  Things are feeling better since last time, pain comes and goes; not really any patterns to it. Have  been trying to do HEP 3x/week this is the only time I have free  PERTINENT HISTORY:   PAIN:  Are you having pain? Yes: NPRS scale: 6/10 Pain location: middle of low back and into R hip  Pain description: aching and sharp  Aggravating factors: laying down getting back up Relieving factors: nothing    PRECAUTIONS: None  WEIGHT BEARING RESTRICTIONS: No  FALLS:  Has patient fallen in last 6 months? No  LIVING ENVIRONMENT: Lives with: lives with their family Lives in: House/apartment Stairs: 3 steps on the inside,2 steps no rails  Has following equipment at  home: None  OCCUPATION: student   PLOF: Independent, Independent with basic ADLs, Independent with gait, and Independent with transfers  PATIENT GOALS: see if I can get feeling a little better    OBJECTIVE:   DIAGNOSTIC FINDINGS:  CLINICAL DATA:  Low back pain radiating to left buttock, no injury   EXAM: LUMBAR SPINE - 2-3 VIEW   COMPARISON:  08/01/2018   FINDINGS: There is no evidence of lumbar spine fracture. Alignment is normal. Disc spaces and vertebral body heights are preserved. Nonobstructive pattern of overlying bowel gas.   IMPRESSION: No fracture or dislocation of the lumbar spine. Disc spaces and vertebral body heights are preserved. Lumbar disc and neural foraminal pathology may be further evaluated by MRI if indicated by neurologically localizing signs and symptoms.  IMPRESSION: 1. No hydronephrosis. No sonographic evidence of pyelonephritis but ultrasound is not sensitive for pyelonephritis    PATIENT SURVEYS:  Oswestry 6/50  SCREENING FOR RED FLAGS: Bowel or bladder incontinence: Yes: started 2 months ago   but later clarifies this is not true incontinence  Spinal tumors: No Cauda equina syndrome: Yes: started 2 months ago  Compression fracture: No Abdominal aneurysm: No  COGNITION: Overall cognitive status: Within functional limits for tasks assessed     SENSATION: WFL sacral  dermatomes intact   MUSCLE LENGTH:  B hamstrings with severe limitation, B piriformis with severe limitation, B quads moderate limitation   POSTURE: rounded shoulders, forward head, increased thoracic kyphosis, and flexed trunk   PALPATION:  R lumbar paraspinals TTP, L glutes/piriformis TTP; lumbar PAs WNL down to L4 then very hypomobile at L5   LUMBAR ROM:   AROM eval 11/21  Flexion Moderate limitation, RFIS increases pain   Severe limitation   Extension Mild limitation, REIS increase in pain  WNL but painful   Right lateral flexion WNL   Left lateral flexion WNL    Right rotation Severe limitation  Severe limitation   Left rotation Severe limitation  Severe limitation    (Blank rows = not tested)  LOWER EXTREMITY ROM:     Active  Right eval Left eval  Hip flexion    Hip extension    Hip abduction    Hip adduction    Hip internal rotation    Hip external rotation    Knee flexion    Knee extension    Ankle dorsiflexion    Ankle plantarflexion    Ankle inversion    Ankle eversion     (Blank rows = not tested)  LOWER EXTREMITY MMT:    MMT Right eval Left eval 11/21 R 11/21 L   Hip flexion 4+ 4+    Hip extension 3+ 3+ 3 3  Hip abduction 3 4+ 4+ 4+  Hip adduction      Hip internal rotation      Hip external rotation      Knee flexion 4+ 4+    Knee extension 5 5    Ankle dorsiflexion 5 5    Ankle plantarflexion      Ankle inversion      Ankle eversion       (Blank rows = not tested)  LUMBAR SPECIAL TESTS:    FUNCTIONAL TESTS:   Clonus (-)  No LLD noted   GAIT:  TODAY'S TREATMENT:  DATE:   05/27/22  TherEx  DKTC 5x10 seconds  Lumbar rotation stretch with contralateral trunk rotation 5x10 seconds B HS stretches 3x30 seconds B Modified cat cow x20 at edge of table  (attempted regular cat cow, pt declined "I don't feel  comfortable doing it" and would not elaborate if it was due to pain or another reason) Attempted childs pose- unable to get correct form QL stretch 3x30 seconds EOB Single leg bridges x10 B Deadlifts- poor form even with max cues, kept rounding back and going to floor level instead of knee level Good mornings 15# KB partial range x10 (tends to round back after a point)       PATIENT EDUCATION:  Education details: exercise form/purpose Person educated: Patient Education method: Explanation, Verbal cues, and Handouts Education comprehension: verbalized understanding and returned demonstration  HOME EXERCISE PROGRAM: Access Code: [email or web address] URL: [email or web address] Date: 05/27/2022 Prepared by: Lerry Liner  Exercises - Supine Lower Trunk Rotation  - 2 x daily - 7 x weekly - 1 sets - 5 reps - 10 hold - Supine Single Knee to Chest Stretch  - 2 x daily - 7 x weekly - 1 sets - 5 reps - 10 hold - Supine Figure 4 Piriformis Stretch  - 2 x daily - 7 x weekly - 1 sets - 3 reps - 30 hold - Single Leg Bridge  - 2 x daily - 7 x weekly - 1 sets - 10 reps - 2 hold - Seated Hamstring Stretch  - 2 x daily - 7 x weekly - 1 sets - 3 reps - Seated Quadratus Lumborum Stretch in Chair  - 2 x daily - 7 x weekly - 1 sets - 3 reps - 30 hold   ASSESSMENT:  CLINICAL IMPRESSION:  Hudsen arrives today doing OK, checked STGs and otherwise continued working on lumbar/hip mobility primarily as he remains very tight. HEP compliance has improved but he does continue to have significant MSK compliants. Has very poor form with biomechanics/functional lifting, will really need to work on this. Will continue efforts.    OBJECTIVE IMPAIRMENTS: decreased knowledge of condition, decreased mobility, difficulty walking, decreased ROM, decreased strength, hypomobility, increased fascial restrictions, increased muscle spasms, impaired flexibility, postural dysfunction, and pain.   ACTIVITY LIMITATIONS:  carrying, lifting, bending, sitting, standing, squatting, sleeping, reach over head, and locomotion level  PARTICIPATION LIMITATIONS: cleaning, laundry, driving, shopping, community activity, occupation, and yard work  PERSONAL FACTORS: Age, Behavior pattern, Education, Fitness, and Past/current experiences are also affecting patient's functional outcome.   REHAB POTENTIAL: Good  CLINICAL DECISION MAKING: Stable/uncomplicated  EVALUATION COMPLEXITY: Low   GOALS: Goals reviewed with patient? No  SHORT TERM GOALS: Target date: 05/29/2022  Will be compliant with appropriate progressive HEP  Baseline: Goal status: IN PROGRESS 11/21- improving 3x/week now   2.  Pain to be no more than 6/10 at worst  Baseline:  Goal status: IN PROGRESS 11/21- 8/10 at worst   3.  Will demonstrate better understanding of general posture and biomechanics  Baseline:  Goal status: IN PROGRESS  4.  Lumbar ROM to be no more than 25% limited on all planes  Baseline:  Goal status: IN PROGRESS severe limitation in flexion and rotation   5.  Hamstring and piriformis mm flexibility limitations to be no more than 25% impaired  Baseline:  Goal status: IN PROGRESS 11/21- still very tight     LONG TERM GOALS: Target date: 06/19/2022  MMT to improve by at least 1 grade in all  weak groups  Baseline:  Goal status: INITIAL  2.  Pain to be no more than 4/10 at worst  Baseline:  Goal status: INITIAL  3.  Will demonstrate good biomechanics for weighted squats and deadlifts to prevent exacerbating condition with gym activities  Baseline:  Goal status: INITIAL  4.  Will be able to sit/stand/walk for unlimited periods of time without increase in pain to allow him to participate in all school and work based activities without increase in pain  Baseline:  Goal status: INITIAL    PLAN:  PT FREQUENCY: 2x/week  PT DURATION: 6 weeks  PLANNED INTERVENTIONS: Therapeutic exercises, Therapeutic activity,  Neuromuscular re-education, Gait training, Patient/Family education, Self Care, Dry Needling, Manual therapy, and Re-evaluation.  PLAN FOR NEXT SESSION: work on lumbar and hip ROM/mobility, core strength, lifting biomechanics, manual/DN PRN    Zalayah Pizzuto U PT DPT PN2  05/27/2022, 2:43 PM   Check all possible CPT codes: 55217 - PT Re-evaluation, 97110- Therapeutic Exercise, [phone]- Neuro Re-education, [phone] - Gait Training, [phone] - Manual Therapy, [phone] - Therapeutic Activities, and [phone] - Self Care    Check all conditions that are expected to impact treatment: Musculoskeletal disorders and Social determinants of health   If treatment provided at initial evaluation, no treatment charged due to lack of authorization.

## 2022-06-03 ENCOUNTER — Telehealth: Payer: Self-pay | Admitting: Physical Therapy

## 2022-06-03 ENCOUNTER — Ambulatory Visit: Payer: Medicaid Other | Admitting: Physical Therapy

## 2022-06-03 NOTE — Telephone Encounter (Signed)
No-show for today's appointment. Called and spoke to patient, he was unable to give a reason for missing today's appointment. Reminded him of date/time of next scheduled PT session.  Lerry Liner PT DPT PN2

## 2022-06-04 ENCOUNTER — Other Ambulatory Visit: Payer: Self-pay | Admitting: Neurology

## 2022-06-04 NOTE — Progress Notes (Deleted)
Referring:  Levonne Lapping, NP [phone],  [place] [address]  PCP: Levonne Lapping, NP  Neurology was asked to evaluate Loralie Champagne, a 21 year old male for a chief complaint of headaches.  Our recommendations of care will be communicated by shared medical record.    CC:  headaches  History provided from ***  HPI:  Medical co-morbidities: hypothyroidism, prediabetes  The patient presents for evaluation of headaches which began***  Headache History: Onset: Triggers: Aura: Location: Quality/Description: Associated Symptoms:  Photophobia:  Phonophobia:  Nausea: Vomiting: Allodynia: Other symptoms: Worse with activity?: Duration of headaches:  Headache days per month: *** Migraine days per month: *** Headache free days per month: ***  Current Treatment: Abortive ***  Preventative ***  Prior Therapies                                 Naproxen 500 mg PRN Imitrex 50 mg PRN Flexeril 5 mg PRN Lexapro 10 mg daily   LABS: ***  IMAGING:  [facility] 04/14/21: unremarkable  ***Imaging independently reviewed on June 04, 2022   Current Outpatient Medications on File Prior to Visit  Medication Sig Dispense Refill   amoxicillin (AMOXIL) 500 MG capsule Take 1 capsule (500 mg total) by mouth 3 (three) times daily. 21 capsule 0   ergocalciferol (VITAMIN D2) 50000 units capsule Take 1 capsule (50,000 Units total) by mouth once a week. 12 capsule 0   levothyroxine (SYNTHROID, LEVOTHROID) 100 MCG tablet Take 1 tablet (100 mcg total) by mouth daily. 30 tablet 11   Omega-3 Fatty Acids (FISH OIL) 1000 MG CAPS Take 1 capsule (1,000 mg total) by mouth 2 (two) times daily. 60 capsule 11   No current facility-administered medications on file prior to visit.     Allergies: No Known Allergies  Family History: Migraine or other headaches in the family:  *** Aneurysms in a first degree relative:  *** Brain tumors in the family:  *** Other  neurological illness in the family:   ***  Past Medical History: Past Medical History:  Diagnosis Date   Hypertriglyceridemia    Hypothyroidism    Morbid obesity (HCC)    Vitamin D deficiency     Past Surgical History No past surgical history on file.  Social History: Social History   Tobacco Use   Smoking status: Never   Smokeless tobacco: Never  Substance Use Topics   Alcohol use: Never   Drug use: Never   ***  ROS: Negative for fevers, chills. Positive for***. All other systems reviewed and negative unless stated otherwise in HPI.   Physical Exam:   Vital Signs: There were no vitals taken for this visit. GENERAL: well appearing,in no acute distress,alert SKIN:  Color, texture, turgor normal. No rashes or lesions HEAD:  Normocephalic/atraumatic. CV:  RRR RESP: Normal respiratory effort MSK: no tenderness to palpation over occiput, neck, or shoulders  NEUROLOGICAL: Mental Status: Alert, oriented to person, place and time,Follows commands Cranial Nerves: PERRL, visual fields intact to confrontation, extraocular movements intact, facial sensation intact, no facial droop or ptosis, hearing grossly intact, no dysarthria, palate elevate symmetrically, tongue protrudes midline, shoulder shrug intact and symmetric Motor: muscle strength 5/5 both upper and lower extremities,no drift, normal tone Reflexes: 2+ throughout Sensation: intact to light touch all 4 extremities Coordination: Finger-to- nose-finger intact bilaterally Gait: normal-based   IMPRESSION: ***  PLAN: ***   I spent a total of *** minutes  chart reviewing and counseling the patient. Headache education was done. Discussed treatment options including preventive and acute medications, natural supplements, and physical therapy. Discussed medication overuse headache and to limit use of acute treatments to no more than 2 days/week or 10 days/month. Discussed medication side effects, adverse reactions and  drug interactions. Written educational materials and patient instructions outlining all of the above were given.  Follow-up: ***   Ocie Doyne, MD 06/04/2022   11:51 AM

## 2022-06-05 ENCOUNTER — Encounter: Payer: Self-pay | Admitting: Psychiatry

## 2022-06-05 ENCOUNTER — Ambulatory Visit: Payer: Medicaid Other | Admitting: Psychiatry

## 2022-06-06 ENCOUNTER — Emergency Department (HOSPITAL_COMMUNITY)
Admission: EM | Admit: 2022-06-06 | Discharge: 2022-06-06 | Discharge status: Against Medical Advice | Payer: Medicaid Other | Attending: Emergency Medicine | Admitting: Emergency Medicine

## 2022-06-06 ENCOUNTER — Other Ambulatory Visit: Payer: Self-pay

## 2022-06-06 DIAGNOSIS — Z5321 Procedure and treatment not carried out due to patient leaving prior to being seen by health care provider: Secondary | ICD-10-CM | POA: Diagnosis not present

## 2022-06-06 DIAGNOSIS — H539 Unspecified visual disturbance: Secondary | ICD-10-CM | POA: Insufficient documentation

## 2022-06-06 DIAGNOSIS — R519 Headache, unspecified: Secondary | ICD-10-CM | POA: Insufficient documentation

## 2022-06-06 LAB — CBC WITH DIFFERENTIAL/PLATELET
Abs Immature Granulocytes: 0.02 10*3/uL (ref 0.00–0.07)
Basophils Absolute: 0 10*3/uL (ref 0.0–0.1)
Basophils Relative: 0 %
Eosinophils Absolute: 0.3 10*3/uL (ref 0.0–0.5)
Eosinophils Relative: 3 %
HCT: 42.4 % (ref 39.0–52.0)
Hemoglobin: 14.4 g/dL (ref 13.0–17.0)
Immature Granulocytes: 0 %
Lymphocytes Relative: 30 %
Lymphs Abs: 2.8 10*3/uL (ref 0.7–4.0)
MCH: 28.4 pg (ref 26.0–34.0)
MCHC: 34 g/dL (ref 30.0–36.0)
MCV: 83.6 fL (ref 80.0–100.0)
Monocytes Absolute: 0.5 10*3/uL (ref 0.1–1.0)
Monocytes Relative: 5 %
Neutro Abs: 5.7 10*3/uL (ref 1.7–7.7)
Neutrophils Relative %: 62 %
Platelets: 400 10*3/uL (ref 150–400)
RBC: 5.07 MIL/uL (ref 4.22–5.81)
RDW: 14.5 % (ref 11.5–15.5)
WBC: 9.4 10*3/uL (ref 4.0–10.5)
nRBC: 0 % (ref 0.0–0.2)

## 2022-06-06 LAB — BASIC METABOLIC PANEL
Anion gap: 11 (ref 5–15)
BUN: 7 mg/dL (ref 6–20)
CO2: 25 mmol/L (ref 22–32)
Calcium: 9.5 mg/dL (ref 8.9–10.3)
Chloride: 105 mmol/L (ref 98–111)
Creatinine, Ser: 0.86 mg/dL (ref 0.61–1.24)
GFR, Estimated: >60 mL/min (ref >60)
Glucose, Bld: 108 mg/dL — ABNORMAL HIGH (ref 70–99)
Potassium: 3.9 mmol/L (ref 3.5–5.1)
Sodium: 141 mmol/L (ref 135–145)

## 2022-06-06 MED ORDER — IBUPROFEN 400 MG PO TABS: 600.0000 mg | ORAL_TABLET | Freq: Once | ORAL | Status: DC

## 2022-06-06 NOTE — ED Triage Notes (Addendum)
Pt presents with headache since yesterday.  Pt ambulatory to triage in NAD.

## 2022-06-06 NOTE — ED Notes (Signed)
Pt left. Pt was advised to stay.  ?

## 2022-06-06 NOTE — ED Provider Triage Note (Signed)
Emergency Medicine Provider Triage Evaluation Note  Gary Maynard, a 21 y.o. male  was evaluated in triage. Pt complains of headache. Started yesterday morning. Onset of headache was sudden. States headache is located in the back of his head and radiates down his upper spine.  Denies fever or nuchal rigidity.  Does endorse intermittent visual disturbance.  At this time his vision is normal.  Denies trauma.  Review of Systems  Positive: See above Negative: See above  Physical Exam  BP 102/81 (BP Location: Right Arm)   Pulse 87   Temp 98.7 F (37.1 C) (Oral)   Resp 16   SpO2 98%  Gen:   Awake, no distress   Resp:  Normal effort  MSK:   Moves extremities without difficulty  Other:    Medical Decision Making  Medically screening exam initiated at 5:56 PM.  Appropriate orders placed.  Ramirez Hernandez-Escamilla was informed that the remainder of the evaluation will be completed by another provider, this initial triage assessment does not replace that evaluation, and the importance of remaining in the ED until their evaluation is complete.  Work up started   Gareth Eagle, PA-C 06/06/22 1801

## 2022-06-09 IMAGING — US US RENAL
1 series · 14 of 25 positions shown · non-contrast
Comparison: None Available.

CLINICAL DATA: Other microscopic hematuria, pt with leukocytosis,
proteinuria, hematuria and left cva tenderness calcium oxalate
crystals on UA

EXAM:
RENAL / URINARY TRACT ULTRASOUND COMPLETE

[Series 1: us renal · 0.25mm/px · 14 of 38 slices shown]
[im 1/38]
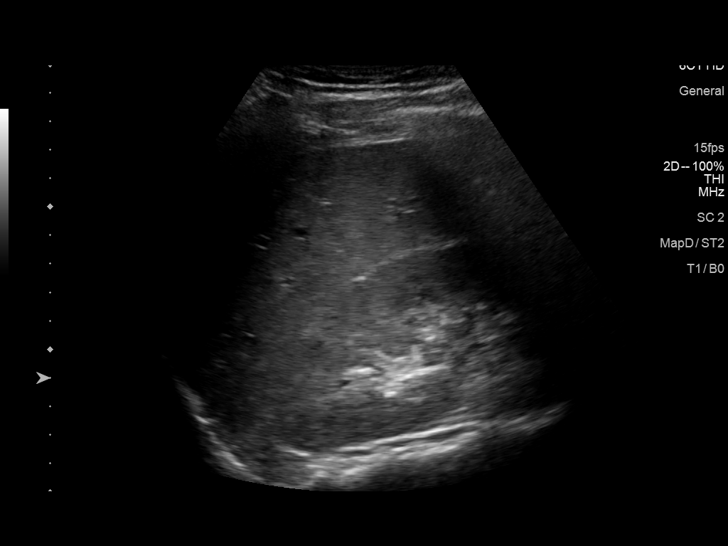
[im 4/38]
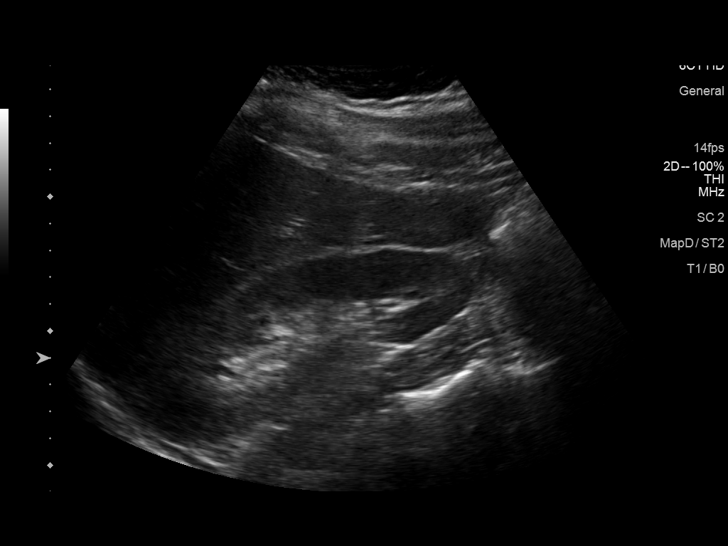
[im 7/38]
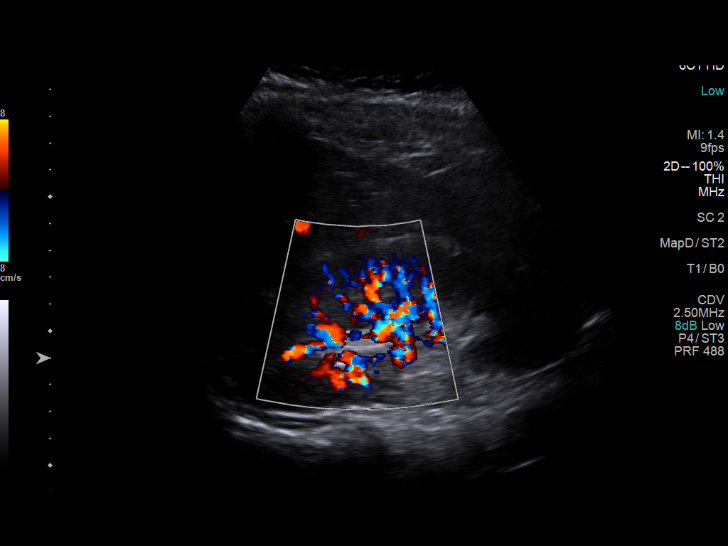
[im 10/38]
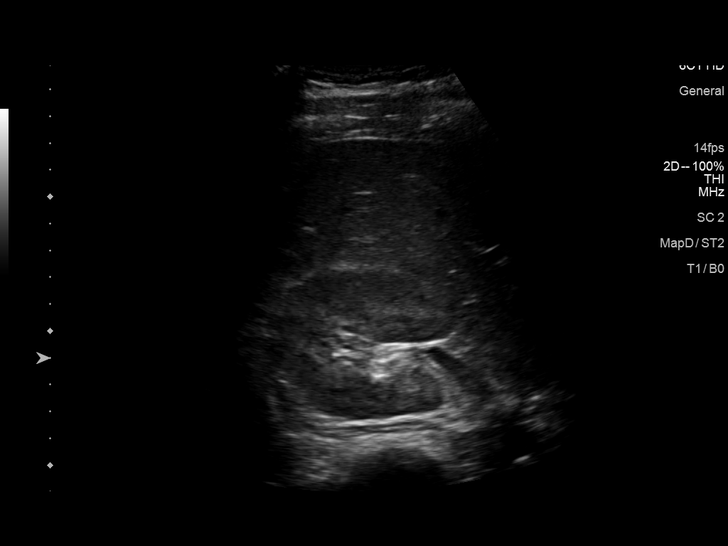
[im 13/38]
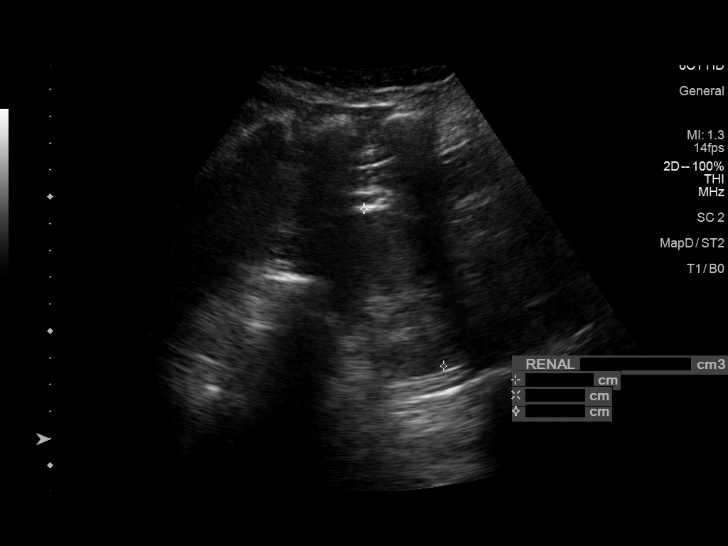
[im 14/38]
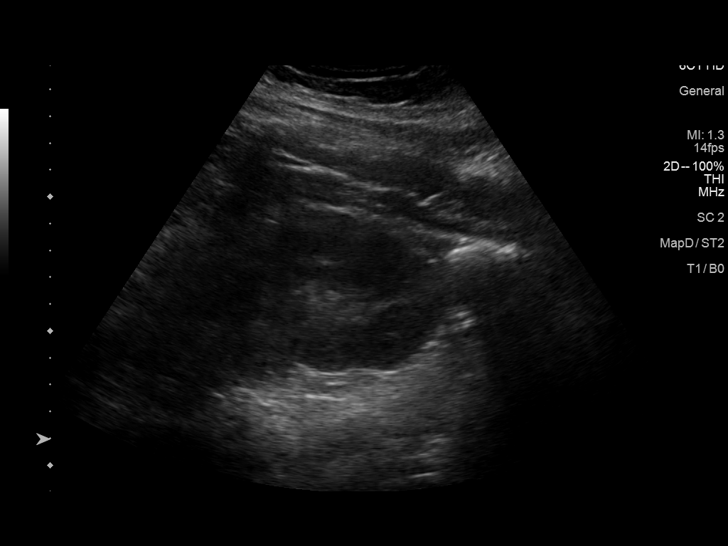
[im 17/38]
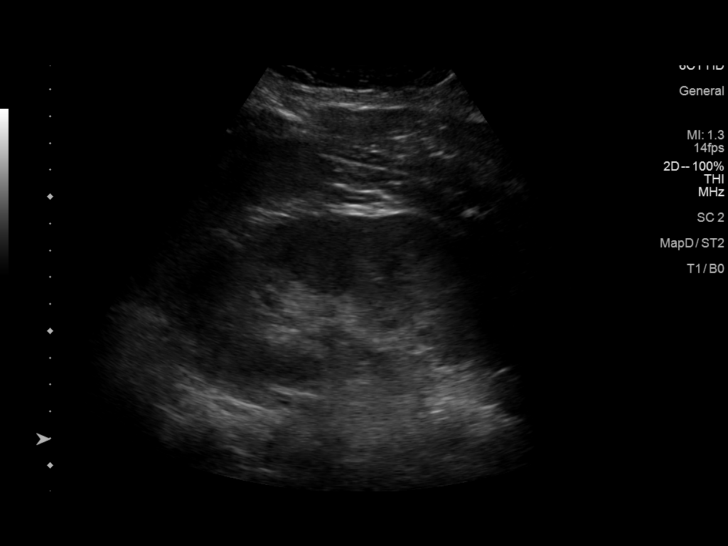
[im 21/38]
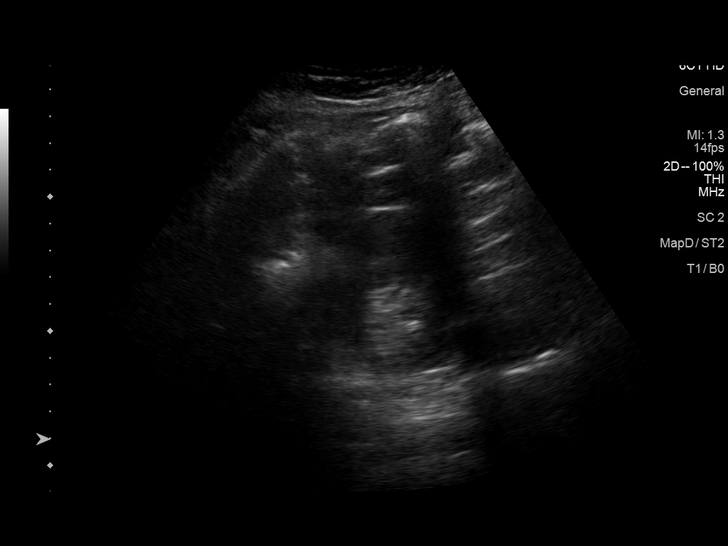
[im 24/38]
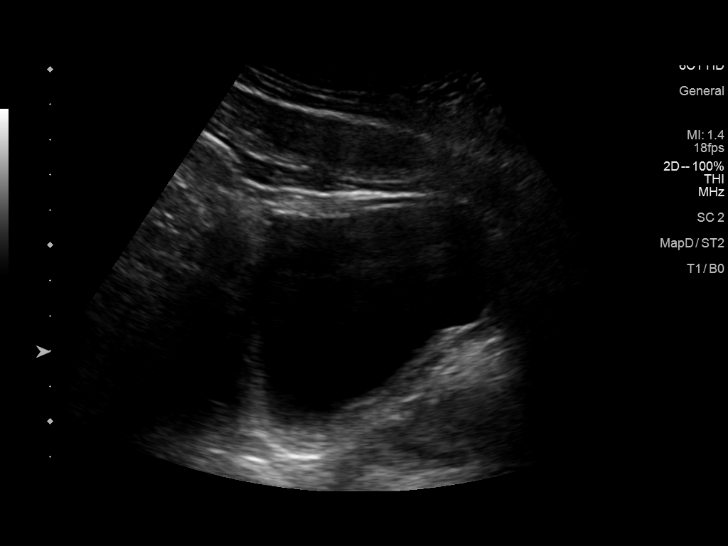
[im 25/38]
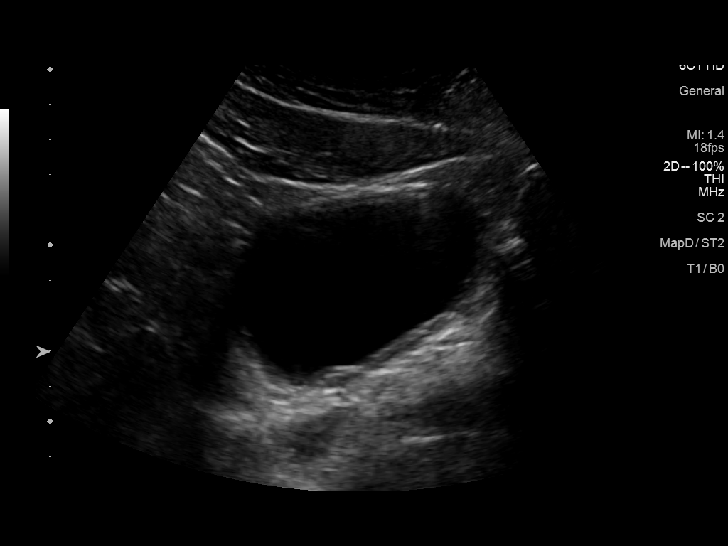
[im 28/38]
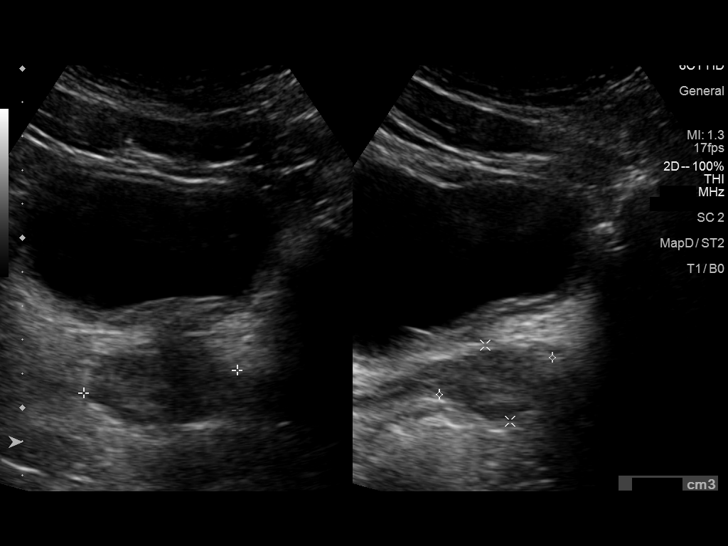
[im 31/38]
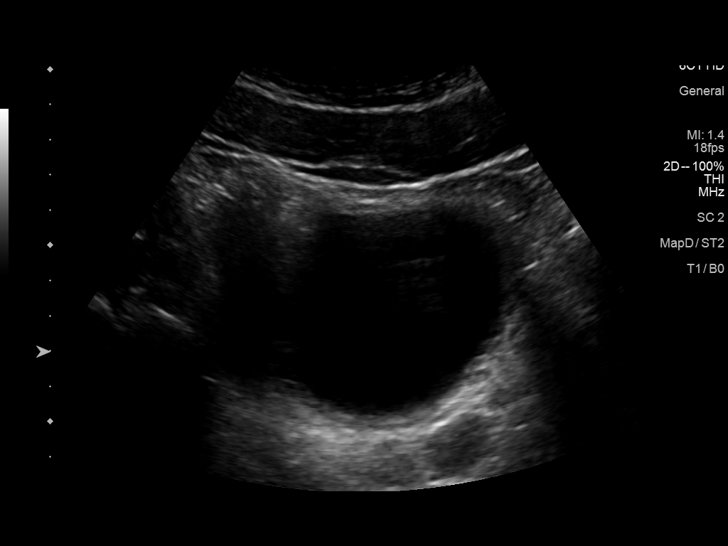
[im 34/38]
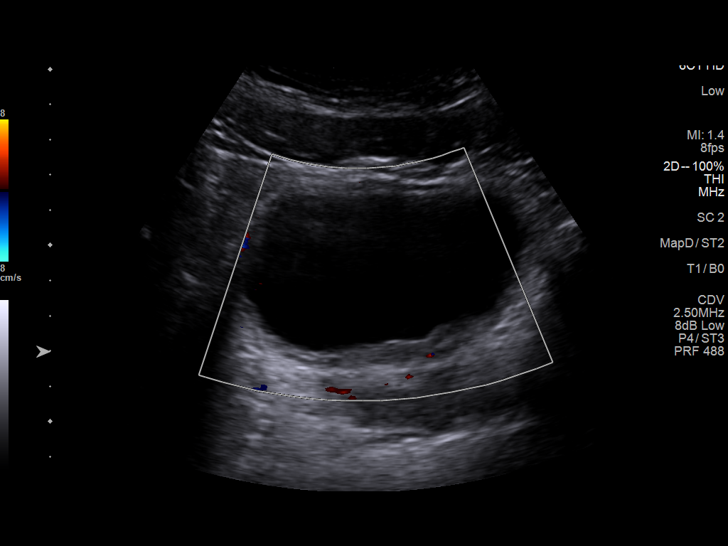
[im 38/38]
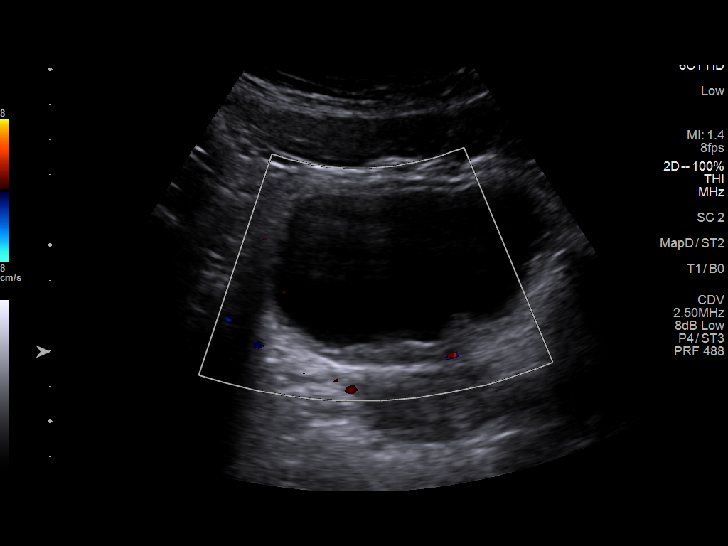

[14 of 25 positions shown; findings below may reference images not displayed]

FINDINGS: Right Kidney:

Renal measurements: 13.4 x 4.8 x 6.1 cm = volume: 205 mL.
Echogenicity within normal limits. No mass or hydronephrosis
visualized.

Left Kidney:

Renal measurements: 14.1 x 6.0 x 6.6 cm = volume: 285 mL.
Echogenicity within normal limits. No mass or hydronephrosis
visualized.

Bladder:

Appears normal for degree of bladder distention. Mild prominence of
the posterior bladder wall is favored to be artifactual due to
technique; recommend correlation with urine analysis.

Other:

None.
IMPRESSION: 1. No hydronephrosis. No sonographic evidence of pyelonephritis but
ultrasound is not sensitive for pyelonephritis.

## 2022-06-10 ENCOUNTER — Ambulatory Visit: Payer: Medicaid Other | Attending: Nurse Practitioner | Admitting: Physical Therapy

## 2022-06-10 DIAGNOSIS — R293 Abnormal posture: Secondary | ICD-10-CM | POA: Insufficient documentation

## 2022-06-10 DIAGNOSIS — R29898 Other symptoms and signs involving the musculoskeletal system: Secondary | ICD-10-CM | POA: Insufficient documentation

## 2022-06-10 DIAGNOSIS — M5459 Other low back pain: Secondary | ICD-10-CM | POA: Insufficient documentation

## 2022-06-10 DIAGNOSIS — M6281 Muscle weakness (generalized): Secondary | ICD-10-CM | POA: Diagnosis present

## 2022-06-10 NOTE — Therapy (Signed)
OUTPATIENT PHYSICAL THERAPY THORACOLUMBAR TREATMENT    Patient Name: Gary Maynard MRN: [id number] DOB:05/26/2001, 21 y.o., male Today's Date: 06/10/2022   PT End of Session - 06/10/22 1317     Visit Number 4    Number of Visits 13    Date for PT Re-Evaluation 06/19/22    Authorization Type healthy blue    Authorization Time Period 05/08/22 to 07/03/22    PT Start Time 1315    PT Stop Time 1400    PT Time Calculation (min) 45 min    Activity Tolerance Patient tolerated treatment well    [facility] tasks assessed/performed                Past Medical History:  Diagnosis Date   Hypertriglyceridemia    Hypothyroidism    Morbid obesity (HCC)    Vitamin D deficiency    No past surgical history on file. 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/18/2015   Prediabetes 12/18/2015   Morbid obesity (HCC) 12/18/2015   Goiter 12/18/2015   Dyspepsia 12/18/2015   Elevated transaminase level 12/18/2015   Subclinical hypothyroidism 08/23/2015   Vitamin D deficiency 08/23/2015   Elevated hemoglobin A1c 08/23/2015   Family history of diabetic complications 08/23/2015    PCP: Levonne Lappingann, Samandra NP   REFERRING PROVIDER: Levonne Lappingann, Samandra, NP   REFERRING DIAG: M54.50 (ICD-10-CM) - Low back pain   Rationale for Evaluation and Treatment: Rehabilitation  THERAPY DIAG:  Other low back pain  Other symptoms and signs involving the musculoskeletal system  Muscle weakness (generalized)  Abnormal posture  ONSET DATE: 05/02/2022   SUBJECTIVE:                                                                                                                                                                                           SUBJECTIVE STATEMENT: Pt reports he went to the ED a few days ago because he woke up with neck pain that was so bad he couldn't get up out  of bed. Pt reports he tried resting for a day and didn't feel better so that is when he went to the ED. However, pt reports he left the ED because he wasn't getting answers about his pain, just had his blood drawn. Pt reports at that time his neck pain was 10/10 but is now down to 5/10. Pt rates his back pain as 6/10 today. Pt reports his back pain has been about the same over the past few weeks, no better and no worse. Pt reports he has not been able  to do this HEP due to being busy with finals, just has one more final before he is done.  PERTINENT HISTORY:   PAIN:  Are you having pain? Yes: NPRS scale: 6/10 Pain location: middle of low back and into R hip  Pain description: aching and sharp  Aggravating factors: laying down getting back up Relieving factors: nothing    PRECAUTIONS: None  WEIGHT BEARING RESTRICTIONS: No  FALLS:  Has patient fallen in last 6 months? No  LIVING ENVIRONMENT: Lives with: lives with their family Lives in: House/apartment Stairs: 3 steps on the inside,2 steps no rails  Has following equipment at home: None  OCCUPATION: student   PLOF: Independent, Independent with basic ADLs, Independent with gait, and Independent with transfers  PATIENT GOALS: see if I can get feeling a little better    OBJECTIVE:   DIAGNOSTIC FINDINGS:  CLINICAL DATA:  Low back pain radiating to left buttock, no injury   EXAM: LUMBAR SPINE - 2-3 VIEW   COMPARISON:  08/01/2018   FINDINGS: There is no evidence of lumbar spine fracture. Alignment is normal. Disc spaces and vertebral body heights are preserved. Nonobstructive pattern of overlying bowel gas.   IMPRESSION: No fracture or dislocation of the lumbar spine. Disc spaces and vertebral body heights are preserved. Lumbar disc and neural foraminal pathology may be further evaluated by MRI if indicated by neurologically localizing signs and symptoms.  IMPRESSION: 1. No hydronephrosis. No sonographic evidence of  pyelonephritis but ultrasound is not sensitive for pyelonephritis    PATIENT SURVEYS:  Oswestry 6/50  SCREENING FOR RED FLAGS: Bowel or bladder incontinence: Yes: started 2 months ago   but later clarifies this is not true incontinence  Spinal tumors: No Cauda equina syndrome: Yes: started 2 months ago  Compression fracture: No Abdominal aneurysm: No  MUSCLE LENGTH:  B hamstrings with severe limitation, B piriformis with severe limitation, B quads moderate limitation   POSTURE: rounded shoulders, forward head, increased thoracic kyphosis, and flexed trunk   PALPATION:  R lumbar paraspinals TTP, L glutes/piriformis TTP; lumbar PAs WNL down to L4 then very hypomobile at L5     TODAY'S TREATMENT:                                                                                                                               THER EX: Seated hamstring stretch 3 x 30 sec each in long-sitting on mat table Seated figure-4 piriformis stretch 3 x 30 sec each Seated anteriolateral leans on therapy ball 3 x 30 sec each direction (L/R) Supine LTR on therapy ball to deepen stretch x 10 reps L/R Deadlifts with 5# KB from 22.5" height with tactile cues and v/c for correct body mechanics x 10 reps During exercise pt feels a stretch in HS and low back Pt needs max cueing for correct exercise performance and body positioning  Encouraged pt to attempt stretches in child's pose position and/or quadruped position this date,  he declines to attempt exercises in these positions today, no reason given.   PATIENT EDUCATION:  Education details: exercise form/purpose, continue with HEP but especially HS and piriformis stretches Person educated: Patient Education method: Explanation, Demonstration, Tactile cues, and Verbal cues Education comprehension: verbalized understanding and returned demonstration  HOME EXERCISE PROGRAM: Access Code: [email or web address] URL: [email or web address] Date:  05/27/2022 Prepared by: Lerry Liner  Exercises - Supine Lower Trunk Rotation  - 2 x daily - 7 x weekly - 1 sets - 5 reps - 10 hold - Supine Single Knee to Chest Stretch  - 2 x daily - 7 x weekly - 1 sets - 5 reps - 10 hold - Supine Figure 4 Piriformis Stretch  - 2 x daily - 7 x weekly - 1 sets - 3 reps - 30 hold - Single Leg Bridge  - 2 x daily - 7 x weekly - 1 sets - 10 reps - 2 hold - Seated Hamstring Stretch  - 2 x daily - 7 x weekly - 1 sets - 3 reps - Seated Quadratus Lumborum Stretch in Chair  - 2 x daily - 7 x weekly - 1 sets - 3 reps - 30 hold   ASSESSMENT:  CLINICAL IMPRESSION: Emphasis of skilled PT session on performing stretching to low back, HS, and piriformis due to ongoing significant tightness and limitations in these areas. Also reviewed proper body mechanics for dead lift. Due to significant tightness in pt hamstrings and low back he is only able to reach 22.5" height safely while maintaining proper form during dead lifts. Pt continues to benefit from skilled therapy services to address ongoing low back and lower limb tightness leading to pain and decreased safety with and ability to perform weight-lifting tasks without risk of further injury. Continue POC.   OBJECTIVE IMPAIRMENTS: decreased knowledge of condition, decreased mobility, difficulty walking, decreased ROM, decreased strength, hypomobility, increased fascial restrictions, increased muscle spasms, impaired flexibility, postural dysfunction, and pain.   ACTIVITY LIMITATIONS: carrying, lifting, bending, sitting, standing, squatting, sleeping, reach over head, and locomotion level  PARTICIPATION LIMITATIONS: cleaning, laundry, driving, shopping, community activity, occupation, and yard work  PERSONAL FACTORS: Age, Behavior pattern, Education, Fitness, and Past/current experiences are also affecting patient's functional outcome.   REHAB POTENTIAL: Good  CLINICAL DECISION MAKING: Stable/uncomplicated  EVALUATION  COMPLEXITY: Low   GOALS: Goals reviewed with patient? No  SHORT TERM GOALS: Target date: 05/29/2022  Will be compliant with appropriate progressive HEP  Baseline: Goal status: IN PROGRESS 11/21- improving 3x/week now   2.  Pain to be no more than 6/10 at worst  Baseline:  Goal status: IN PROGRESS 11/21- 8/10 at worst   3.  Will demonstrate better understanding of general posture and biomechanics  Baseline:  Goal status: IN PROGRESS  4.  Lumbar ROM to be no more than 25% limited on all planes  Baseline:  Goal status: IN PROGRESS severe limitation in flexion and rotation   5.  Hamstring and piriformis mm flexibility limitations to be no more than 25% impaired  Baseline:  Goal status: IN PROGRESS 11/21- still very tight     LONG TERM GOALS: Target date: 06/19/2022  MMT to improve by at least 1 grade in all weak groups  Baseline:  Goal status: INITIAL  2.  Pain to be no more than 4/10 at worst  Baseline:  Goal status: INITIAL  3.  Will demonstrate good biomechanics for weighted squats and deadlifts to prevent exacerbating condition with gym activities  Baseline:  Goal  status: INITIAL  4.  Will be able to sit/stand/walk for unlimited periods of time without increase in pain to allow him to participate in all school and work based activities without increase in pain  Baseline:  Goal status: INITIAL    PLAN:  PT FREQUENCY: 2x/week  PT DURATION: 6 weeks  PLANNED INTERVENTIONS: Therapeutic exercises, Therapeutic activity, Neuromuscular re-education, Gait training, Patient/Family education, Self Care, Dry Needling, Manual therapy, and Re-evaluation.  PLAN FOR NEXT SESSION: assess goals, discuss POC (pt wanting to do PT 2x/week per POC but through insurance he is only approved for 5 total visits through the end of the year, will need to recert and ask for more visits, will likely need to cancel 12/19 as he will not be approved for more visits by then?), work on lumbar  and hip ROM/mobility, core strength, lifting biomechanics, manual/DN PRN    Peter Congo, PT, DPT, CSRS  06/10/2022, 2:02 PM   Check all possible CPT codes: 03013 - PT Re-evaluation, 97110- Therapeutic Exercise, [id number]- Neuro Re-education, [id number] - Gait Training, [id number] - Manual Therapy, 97530 - Therapeutic Activities, and 97535 - Self Care    Check all conditions that are expected to impact treatment: Musculoskeletal disorders and Social determinants of health   If treatment provided at initial evaluation, no treatment charged due to lack of authorization.

## 2022-06-17 ENCOUNTER — Encounter: Payer: Self-pay | Admitting: Physical Therapy

## 2022-06-17 ENCOUNTER — Ambulatory Visit: Payer: Medicaid Other | Admitting: Physical Therapy

## 2022-06-17 DIAGNOSIS — M6281 Muscle weakness (generalized): Secondary | ICD-10-CM

## 2022-06-17 DIAGNOSIS — R29898 Other symptoms and signs involving the musculoskeletal system: Secondary | ICD-10-CM

## 2022-06-17 DIAGNOSIS — M5459 Other low back pain: Secondary | ICD-10-CM

## 2022-06-17 DIAGNOSIS — R293 Abnormal posture: Secondary | ICD-10-CM

## 2022-06-17 NOTE — Therapy (Signed)
OUTPATIENT PHYSICAL THERAPY THORACOLUMBAR TREATMENT/RE-CERT   Patient Name: Gary Maynard MRN: [id number] DOB:05-Mar-2001, 21 y.o., male Today's Date: 06/17/2022   PT End of Session - 06/17/22 1317     Visit Number 5    Number of Visits 21   13+8   Date for PT Re-Evaluation 53/61/44   re-cert completed on 31/54/0086   Authorization Type healthy blue    Authorization Time Period 05/08/22 to 07/03/22    PT Start Time 1315    PT Stop Time 1355    PT Time Calculation (min) 40 min    Activity Tolerance Patient tolerated treatment well    Behavior During Therapy [facility] for tasks assessed/performed                Past Medical History:  Diagnosis Date   Hypertriglyceridemia    Hypothyroidism    Morbid obesity ([place])    Vitamin D deficiency    History reviewed. No pertinent surgical history. 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/18/2015   Prediabetes 12/18/2015   Morbid obesity ([place]) 12/18/2015   Goiter 12/18/2015   Dyspepsia 12/18/2015   Elevated transaminase level 12/18/2015   Subclinical hypothyroidism 08/23/2015   Vitamin D deficiency 08/23/2015   Elevated hemoglobin A1c 08/23/2015   Family history of diabetic complications 76/19/5093    PCP: Dianna Rossetti NP   REFERRING PROVIDER: Dianna Rossetti, NP   REFERRING DIAG: M54.50 (ICD-10-CM) - Low back pain   Rationale for Evaluation and Treatment: Rehabilitation  THERAPY DIAG:  Other low back pain - Plan: PT plan of care cert/re-cert  Other symptoms and signs involving the musculoskeletal system - Plan: PT plan of care cert/re-cert  Muscle weakness (generalized) - Plan: PT plan of care cert/re-cert  Abnormal posture - Plan: PT plan of care cert/re-cert  ONSET DATE: 26/71/2458   SUBJECTIVE:                                                                                                                                                                                            SUBJECTIVE STATEMENT: Pt reports his neck is better today.  He denies other changes or acute issues since visit prior.  PERTINENT HISTORY:   PAIN:  Are you having pain? Yes: NPRS scale: 6/10 Pain location: middle of back  Pain description: pulling Aggravating factors: laying down then getting back up Relieving factors: nothing    PRECAUTIONS: None  WEIGHT BEARING RESTRICTIONS: No  FALLS:  Has patient fallen in last 6 months? No  LIVING ENVIRONMENT: Lives with: lives with their family Lives in: House/apartment Stairs:  3 steps on the inside,2 steps no rails  Has following equipment at home: None  OCCUPATION: student   PLOF: Independent, Independent with basic ADLs, Independent with gait, and Independent with transfers  PATIENT GOALS: see if I can get feeling a little better    OBJECTIVE:   DIAGNOSTIC FINDINGS:  CLINICAL DATA:  Low back pain radiating to left buttock, no injury   EXAM: LUMBAR SPINE - 2-3 VIEW   COMPARISON:  08/01/2018   FINDINGS: There is no evidence of lumbar spine fracture. Alignment is normal. Disc spaces and vertebral body heights are preserved. Nonobstructive pattern of overlying bowel gas.   IMPRESSION: No fracture or dislocation of the lumbar spine. Disc spaces and vertebral body heights are preserved. Lumbar disc and neural foraminal pathology may be further evaluated by MRI if indicated by neurologically localizing signs and symptoms.  IMPRESSION: 1. No hydronephrosis. No sonographic evidence of pyelonephritis but ultrasound is not sensitive for pyelonephritis    PATIENT SURVEYS:  Oswestry 6/50  SCREENING FOR RED FLAGS: Bowel or bladder incontinence: Yes: started 2 months ago   but later clarifies this is not true incontinence  Spinal tumors: No Cauda equina syndrome: Yes: started 2 months ago  Compression fracture: No Abdominal aneurysm: No  MUSCLE  LENGTH:  B hamstrings with severe limitation, B piriformis with severe limitation, B quads moderate limitation   POSTURE: rounded shoulders, forward head, increased thoracic kyphosis, and flexed trunk   PALPATION:  R lumbar paraspinals TTP, L glutes/piriformis TTP; lumbar PAs WNL down to L4 then very hypomobile at L5     TODAY'S TREATMENT:       Assessed LTGs: -Pain at worst over 7 days 8/10. -15 lb kettlebell deadlift x10, 15lb kettlebell squats x10 w/ 7/10 pain in back and proper form noted - MMT Right (12/12) Left (12/12)  Hip flexion 4+ 4  Hip extension 4-/5 4  Hip abduction 4+ 4  Hip adduction      Hip internal rotation      Hip external rotation      Knee flexion 5 5  Knee extension 5 5  Ankle dorsiflexion 5 5  Ankle plantarflexion      Ankle inversion      Ankle eversion       (Blank rows = not tested) -Bilateral 80lbs leg press 2x15  -3-way lumbar rollouts w/ physioball x10 each direction -Treadmill at 2.84mh x10 mins progressing to 8% incline w/ BUE support for general cardiovascular endurance, pt endorses pain of 5/10 at end of ambulatory task -Discussed walking program, see below.  PATIENT EDUCATION:  Education details: Continue HEP, initial walking program and benefits.  Return to gym activity w/ proper form.  Progress towards goals and POC and process for re-cert. Person educated: Patient Education method: Explanation, Demonstration, Tactile cues, and Verbal cues Education comprehension: verbalized understanding and returned demonstration  HOME EXERCISE PROGRAM: Access Code: [email or web address]: [email or web address] Date: 05/27/2022 Prepared by: KAnn Lions Exercises - Supine Lower Trunk Rotation  - 2 x daily - 7 x weekly - 1 sets - 5 reps - 10 hold - Supine Single Knee to Chest Stretch  - 2 x daily - 7 x weekly - 1 sets - 5 reps - 10 hold - Supine Figure 4 Piriformis Stretch  - 2 x daily - 7 x weekly - 1 sets - 3 reps - 30 hold - Single Leg  Bridge  - 2 x daily - 7 x weekly - 1 sets - 10 reps - 2  hold - Seated Hamstring Stretch  - 2 x daily - 7 x weekly - 1 sets - 3 reps - Seated Quadratus Lumborum Stretch in Chair  - 2 x daily - 7 x weekly - 1 sets - 3 reps - 30 hold  You Can Walk For A Certain Length Of Time Each Day                          Walk 10 minutes 1 times per day.             Increase 5  minutes every 14 days              Work up to 20-30 minutes (1-2 times per day).               Example:                         Day 1-2           4-5 minutes     3 times per day                         Day 7-8           10-12 minutes 2-3 times per day                         Day 13-14       20-22 minutes 1-2 times per day  ASSESSMENT:  CLINICAL IMPRESSION: Focus of skilled session on assessing LTGs with plan to re-cert.  He continues to have ongoing midline low back pain interfering with general activity participation and return to regular gym schedule.  His overall LE strength is improving and he continues to have pain response to prolonged activity suggesting MSK involvement vs other insidious issue.  General lifting mechanics are improving with only mild upper thoracic kyphotic posture noted during 15lb kettlebell lifts and squats.  He continues to benefit from skilled PT to further address pain management and improve quality of life in order to return to normal routine and activities.   OBJECTIVE IMPAIRMENTS: decreased knowledge of condition, decreased mobility, difficulty walking, decreased ROM, decreased strength, hypomobility, increased fascial restrictions, increased muscle spasms, impaired flexibility, postural dysfunction, and pain.   ACTIVITY LIMITATIONS: carrying, lifting, bending, sitting, standing, squatting, sleeping, reach over head, and locomotion level  PARTICIPATION LIMITATIONS: cleaning, laundry, driving, shopping, community activity, occupation, and yard work  PERSONAL FACTORS: Age, Behavior pattern, Education,  Fitness, and Past/current experiences are also affecting patient's functional outcome.   REHAB POTENTIAL: Good  CLINICAL DECISION MAKING: Stable/uncomplicated  EVALUATION COMPLEXITY: Low  OLD GOALS: Goals reviewed with patient? No  SHORT TERM GOALS: Target date: 05/29/2022  Will be compliant with appropriate progressive HEP  Baseline: Goal status: IN PROGRESS 11/21- improving 3x/week now   2.  Pain to be no more than 6/10 at worst  Baseline:  Goal status: IN PROGRESS 11/21- 8/10 at worst   3.  Will demonstrate better understanding of general posture and biomechanics  Baseline:  Goal status: IN PROGRESS  4.  Lumbar ROM to be no more than 25% limited on all planes  Baseline:  Goal status: IN PROGRESS severe limitation in flexion and rotation   5.  Hamstring and piriformis mm flexibility limitations to be no more than 25% impaired  Baseline:  Goal status: IN PROGRESS 11/21-  still very tight   LONG TERM GOALS: Target date: 06/19/2022  MMT to improve by at least 1 grade in all weak groups  Baseline:   MMT Right (12/12) Left (12/12)  Hip flexion 4+ 4  Hip extension 4-/5 4  Hip abduction 4+ 4  Hip adduction      Hip internal rotation      Hip external rotation      Knee flexion 5 5  Knee extension 5 5  Ankle dorsiflexion 5 5  Ankle plantarflexion      Ankle inversion      Ankle eversion       (Blank rows = not tested) Goal status: MET  2.  Pain to be no more than 4/10 at worst  Baseline: 8/10 at worst over last week (12/12) Goal status: NOT MET  3.  Will demonstrate good biomechanics for weighted squats and deadlifts to prevent exacerbating condition with gym activities  Baseline: Reviewed both squats and deadlifts with good mechanics noted. Goal status: MET  4.  Will be able to sit/stand/walk for unlimited periods of time without increase in pain to allow him to participate in all school and work based activities without increase in pain  Baseline:  Goal  status: INITIAL  NEW GOALS: Goals reviewed with patient? YES  SHORT TERM GOALS: Target date: 07/11/2021  Pt will be independent with strength and stretching HEP to promote pain management and safe return to gym. Baseline:  Established, needs advancement Goal status: INITIAL  2.  Pain to be no more than 6/10 at worst over previous 7 days in order to demonstrate improving quality of life and pain management.  Baseline: 8/10 (12/12) Goal status: INITIAL  LONG TERM GOALS: Target date: 08/01/2021  Pt to be compliant to walking program x3 days per week to promote general aerobic and lumbar vascular health.  Baseline: Established 06/17/2022 10 mins per day Goal status: INITIAL  2.  Pain to be no more than 4/10 at worst over last 7 days to demonstrate improved pain management and quality of life. Baseline: 8/10 at worst over last week (12/12) Goal status: INITIAL  3.  Pt will ambulate >/=2000' feet independently w/o increase pain of 2 or more intervals to demonstrate improved activity tolerance and pain management.  Baseline: Generalized inc pain w/ activity Goal status:  INITIAL  4.  Pt will perform weighted carry over level and unlevel surfaces and up and down stairs without increased pain and demonstrating good carrying mechanics in order to improve access to community environment and improved safety with high level physical activity.  Baseline: Weighted carry to be assessed. Goal status: INITIAL  PLAN:  PT FREQUENCY: 2x/week  PT DURATION: 6 weeks (only schedule for 4 weeks)  PLANNED INTERVENTIONS: Therapeutic exercises, Therapeutic activity, Neuromuscular re-education, Gait training, Patient/Family education, Self Care, Dry Needling, Manual therapy, and Re-evaluation.  PLAN FOR NEXT SESSION: Note about POC: scheduled for 2x/wk, but if not approved may need to cancel visits and call pt to inform!, work on lumbar and hip ROM/mobility, core strength, lifting/carrying biomechanics,  manual/DN PRN    Elease Etienne, PT, DPT  06/17/2022, 3:04 PM   Check all possible CPT codes: 97164 - PT Re-evaluation, 97110- Therapeutic Exercise, [phone]- Neuro Re-education, [phone] - Gait Training, [phone] - Manual Therapy, 97530 - Therapeutic Activities, and [phone] - Self Care    Check all conditions that are expected to impact treatment: Musculoskeletal disorders and Social determinants of health   If treatment provided at initial evaluation,  no treatment charged due to lack of authorization.

## 2022-06-24 ENCOUNTER — Ambulatory Visit: Payer: Medicaid Other | Admitting: Physical Therapy

## 2022-07-08 ENCOUNTER — Ambulatory Visit: Payer: Medicaid Other | Attending: Nurse Practitioner | Admitting: Physical Therapy

## 2022-07-08 DIAGNOSIS — R29898 Other symptoms and signs involving the musculoskeletal system: Secondary | ICD-10-CM | POA: Insufficient documentation

## 2022-07-08 DIAGNOSIS — M5459 Other low back pain: Secondary | ICD-10-CM | POA: Diagnosis not present

## 2022-07-08 DIAGNOSIS — M6281 Muscle weakness (generalized): Secondary | ICD-10-CM | POA: Diagnosis present

## 2022-07-08 DIAGNOSIS — R293 Abnormal posture: Secondary | ICD-10-CM | POA: Diagnosis present

## 2022-07-08 NOTE — Therapy (Signed)
OUTPATIENT PHYSICAL THERAPY THORACOLUMBAR TREATMENT   Patient Name: Gary Maynard MRN: [id number] DOB:Nov 25, 2000, 22 y.o., male Today's Date: 07/08/2022   PT End of Session - 07/08/22 1322     Visit Number 6    Number of Visits 21   13+8   Date for PT Re-Evaluation 91/69/45   re-cert completed on 03/88/8280   Authorization Type healthy blue    Authorization Time Period 05/08/22 to 07/03/22    PT Start Time 1321   Pt arrived late   PT Stop Time 1401    PT Time Calculation (min) 40 min    Activity Tolerance Patient tolerated treatment well    Behavior During Therapy [facility] for tasks assessed/performed;Flat affect                Past Medical History:  Diagnosis Date   Hypertriglyceridemia    Hypothyroidism    Morbid obesity ([place])    Vitamin D deficiency    No past surgical history on file. 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/18/2015   Prediabetes 12/18/2015   Morbid obesity ([place]) 12/18/2015   Goiter 12/18/2015   Dyspepsia 12/18/2015   Elevated transaminase level 12/18/2015   Subclinical hypothyroidism 08/23/2015   Vitamin D deficiency 08/23/2015   Elevated hemoglobin A1c 08/23/2015   Family history of diabetic complications 03/49/1791    PCP: Dianna Rossetti NP   REFERRING PROVIDER: Dianna Rossetti, NP   REFERRING DIAG: M54.50 (ICD-10-CM) - Low back pain   Rationale for Evaluation and Treatment: Rehabilitation  THERAPY DIAG:  Other low back pain  Muscle weakness (generalized)  ONSET DATE: 05/02/2022   SUBJECTIVE:                                                                                                                                                                                           SUBJECTIVE STATEMENT: Pt reports his back pain was a 9/10 this morning, is a little bit better right now, at a 7/10. Notices a lot of shakiness after doing leg  presses. Does not know where his HEP is.   PERTINENT HISTORY:   PAIN:  Are you having pain? Yes: NPRS scale: 7/10 Pain location: middle of back  Pain description: pulling Aggravating factors: laying down then getting back up Relieving factors: nothing    PRECAUTIONS: None  WEIGHT BEARING RESTRICTIONS: No  FALLS:  Has patient fallen in last 6 months? No  LIVING ENVIRONMENT: Lives with: lives with their family Lives in: House/apartment Stairs: 3 steps on the inside,2 steps no rails  Has following equipment at home: None  OCCUPATION: student   PLOF: Independent, Independent with basic ADLs, Independent with gait, and Independent with transfers  PATIENT GOALS: see if I can get feeling a little better    OBJECTIVE:   DIAGNOSTIC FINDINGS:  CLINICAL DATA:  Low back pain radiating to left buttock, no injury   EXAM: LUMBAR SPINE - 2-3 VIEW   COMPARISON:  08/01/2018   FINDINGS: There is no evidence of lumbar spine fracture. Alignment is normal. Disc spaces and vertebral body heights are preserved. Nonobstructive pattern of overlying bowel gas.   IMPRESSION: No fracture or dislocation of the lumbar spine. Disc spaces and vertebral body heights are preserved. Lumbar disc and neural foraminal pathology may be further evaluated by MRI if indicated by neurologically localizing signs and symptoms.  IMPRESSION: 1. No hydronephrosis. No sonographic evidence of pyelonephritis but ultrasound is not sensitive for pyelonephritis    PATIENT SURVEYS:  Oswestry 6/50  SCREENING FOR RED FLAGS: Bowel or bladder incontinence: Yes: started 2 months ago   but later clarifies this is not true incontinence  Spinal tumors: No Cauda equina syndrome: Yes: started 2 months ago  Compression fracture: No Abdominal aneurysm: No  MUSCLE LENGTH:  B hamstrings with severe limitation, B piriformis with severe limitation, B quads moderate limitation   POSTURE: rounded shoulders, forward  head, increased thoracic kyphosis, and flexed trunk   PALPATION:  R lumbar paraspinals TTP, L glutes/piriformis TTP; lumbar PAs WNL down to L4 then very hypomobile at L5     TODAY'S TREATMENT:       Ther Ex  Seated theraball roll outs using green ball, x10 reps in fwd and lateral directions, for improved spinal ROM and latissimus stretch  Attempted supine iron crosses for improved lower lumbar mobility and hamstring stretch, but pt unable to perform due to hamstring tightness  Performed manual HS stretch, x60s per side, in supine  Reviewed seated HS stretch (pt had not been performing) and held for 90s per side.  Standing calf stretch, x90s per side, for improved ankle mobility and static stretch to be performed following lifting. Added to HEP (see bolded below)  Discussed performing elevated or Sumo deadlifts at gym to reduce low back pain, as pt reports this is the only exercise that aggravates his back. Pt reported he would trial this and report back.  Pt rated pain as 6/10 following session   PATIENT EDUCATION:  Education details: continue HEP (provided new handout), modifications to deadlifts   Person educated: Patient Education method: Explanation, Demonstration, Tactile cues, and Verbal cues Education comprehension: verbalized understanding and returned demonstration  HOME EXERCISE PROGRAM: Access Code: [email or web address] URL: [email or web address] Date: 07/08/2022 Prepared by: Mickie Bail Dalana Pfahler  Exercises - Supine Lower Trunk Rotation  - 2 x daily - 7 x weekly - 1 sets - 5 reps - 10 hold - Supine Single Knee to Chest Stretch  - 2 x daily - 7 x weekly - 1 sets - 5 reps - 10 hold - Supine Figure 4 Piriformis Stretch  - 2 x daily - 7 x weekly - 1 sets - 3 reps - 30 hold - Single Leg Bridge  - 2 x daily - 7 x weekly - 1 sets - 10 reps - 2 hold - Seated Hamstring Stretch  - 2 x daily - 7 x weekly - 1 sets - 3 reps - Seated Quadratus Lumborum Stretch in Chair  - 2 x daily - 7 x  weekly - 1 sets - 3 reps - 30 hold - Calf stretch  -  1 x daily - 7 x weekly - 3 reps - 30second hold  You Can Walk For A Certain Length Of Time Each Day                          Walk 10 minutes 1 times per day.             Increase 5  minutes every 14 days              Work up to 20-30 minutes (1-2 times per day).               Example:                         Day 1-2           4-5 minutes     3 times per day                         Day 7-8           10-12 minutes 2-3 times per day                         Day 13-14       20-22 minutes 1-2 times per day  ASSESSMENT:  CLINICAL IMPRESSION: Emphasis of skilled PT session on hamstring stretching and reviewing HEP. Pt continues to report high back pain levels, likely due to sleeping on ground over weekend rather than in his bed. Pt does report higher levels of pain if he sleeps in bad positions. Reviewed HEP with pt and emphasized importance of performing dynamic stretches prior to lifting and static stretches after lifting. Pt currently not performing stretches or HEP. Pt in agreement to trial modifications to deadlift form to determine if this reduces his low back pain. Continue POC.    OBJECTIVE IMPAIRMENTS: decreased knowledge of condition, decreased mobility, difficulty walking, decreased ROM, decreased strength, hypomobility, increased fascial restrictions, increased muscle spasms, impaired flexibility, postural dysfunction, and pain.   ACTIVITY LIMITATIONS: carrying, lifting, bending, sitting, standing, squatting, sleeping, reach over head, and locomotion level  PARTICIPATION LIMITATIONS: cleaning, laundry, driving, shopping, community activity, occupation, and yard work  PERSONAL FACTORS: Age, Behavior pattern, Education, Fitness, and Past/current experiences are also affecting patient's functional outcome.   REHAB POTENTIAL: Good  CLINICAL DECISION MAKING: Stable/uncomplicated  EVALUATION COMPLEXITY: Low  OLD GOALS: Goals  reviewed with patient? No  SHORT TERM GOALS: Target date: 05/29/2022  Will be compliant with appropriate progressive HEP  Baseline: Goal status: IN PROGRESS 11/21- improving 3x/week now   2.  Pain to be no more than 6/10 at worst  Baseline:  Goal status: IN PROGRESS 11/21- 8/10 at worst   3.  Will demonstrate better understanding of general posture and biomechanics  Baseline:  Goal status: IN PROGRESS  4.  Lumbar ROM to be no more than 25% limited on all planes  Baseline:  Goal status: IN PROGRESS severe limitation in flexion and rotation   5.  Hamstring and piriformis mm flexibility limitations to be no more than 25% impaired  Baseline:  Goal status: IN PROGRESS 11/21- still very tight   LONG TERM GOALS: Target date: 06/19/2022  MMT to improve by at least 1 grade in all weak groups  Baseline:   MMT Right (12/12) Left (12/12)  Hip flexion 4+ 4  Hip extension 4-/5 4  Hip abduction 4+ 4  Hip adduction      Hip internal rotation      Hip external rotation      Knee flexion 5 5  Knee extension 5 5  Ankle dorsiflexion 5 5  Ankle plantarflexion      Ankle inversion      Ankle eversion       (Blank rows = not tested) Goal status: MET  2.  Pain to be no more than 4/10 at worst  Baseline: 8/10 at worst over last week (12/12) Goal status: NOT MET  3.  Will demonstrate good biomechanics for weighted squats and deadlifts to prevent exacerbating condition with gym activities  Baseline: Reviewed both squats and deadlifts with good mechanics noted. Goal status: MET  4.  Will be able to sit/stand/walk for unlimited periods of time without increase in pain to allow him to participate in all school and work based activities without increase in pain  Baseline:  Goal status: INITIAL  NEW GOALS: Goals reviewed with patient? YES  SHORT TERM GOALS: Target date: 07/11/2021  Pt will be independent with strength and stretching HEP to promote pain management and safe return to  gym. Baseline:  Established, needs advancement Goal status: INITIAL  2.  Pain to be no more than 6/10 at worst over previous 7 days in order to demonstrate improving quality of life and pain management.  Baseline: 8/10 (12/12) Goal status: INITIAL  LONG TERM GOALS: Target date: 08/01/2021  Pt to be compliant to walking program x3 days per week to promote general aerobic and lumbar vascular health.  Baseline: Established 06/17/2022 10 mins per day Goal status: INITIAL  2.  Pain to be no more than 4/10 at worst over last 7 days to demonstrate improved pain management and quality of life. Baseline: 8/10 at worst over last week (12/12) Goal status: INITIAL  3.  Pt will ambulate >/=2000' feet independently w/o increase pain of 2 or more intervals to demonstrate improved activity tolerance and pain management.  Baseline: Generalized inc pain w/ activity Goal status:  INITIAL  4.  Pt will perform weighted carry over level and unlevel surfaces and up and down stairs without increased pain and demonstrating good carrying mechanics in order to improve access to community environment and improved safety with high level physical activity.  Baseline: Weighted carry to be assessed. Goal status: INITIAL  PLAN:  PT FREQUENCY: 2x/week  PT DURATION: 6 weeks (only schedule for 4 weeks)  PLANNED INTERVENTIONS: Therapeutic exercises, Therapeutic activity, Neuromuscular re-education, Gait training, Patient/Family education, Self Care, Dry Needling, Manual therapy, and Re-evaluation.  PLAN FOR NEXT SESSION: Note about POC: scheduled for 2x/wk, but if not approved may need to cancel visits and call pt to inform!, work on lumbar and hip ROM/mobility, core strength, lifting/carrying biomechanics, manual/DN PRN, farmer's carries, goodmornings, wobble bar?     Charlett Nose, PT, [place] [address] [place], [place]  [address] Phone:  [phone] Fax:   [phone] 07/08/2022, 2:05 PM   Check all possible CPT codes: [phone] - PT Re-evaluation, 97110- Therapeutic Exercise, [phone]- Neuro Re-education, [phone] - Gait Training, [phone] - Manual Therapy, [phone] - Therapeutic Activities, and [phone] - Self Care    Check all conditions that are expected to impact treatment: Musculoskeletal disorders and Social determinants of health   If treatment provided at initial evaluation, no treatment charged due to lack of authorization.

## 2022-07-10 ENCOUNTER — Ambulatory Visit: Payer: Medicaid Other | Admitting: Physical Therapy

## 2022-07-15 ENCOUNTER — Ambulatory Visit: Payer: Medicaid Other | Admitting: Physical Therapy

## 2022-07-15 DIAGNOSIS — M5459 Other low back pain: Secondary | ICD-10-CM

## 2022-07-15 DIAGNOSIS — M6281 Muscle weakness (generalized): Secondary | ICD-10-CM

## 2022-07-15 NOTE — Therapy (Signed)
OUTPATIENT PHYSICAL THERAPY THORACOLUMBAR TREATMENT   Patient Name: Gary Maynard MRN: [id number] DOB:2001/04/04, 22 y.o., male Today's Date: 07/15/2022   PT End of Session - 07/15/22 1322     Visit Number 7    Number of Visits 21   13+8   Date for PT Re-Evaluation 30/16/01   re-cert completed on 06/17/2022   Authorization Type healthy blue    Authorization Time Period 05/08/22 to 07/03/22    PT Start Time 1321   Pt in restroom   PT Stop Time 1404    PT Time Calculation (min) 43 min    Equipment Utilized During Treatment Gait belt    Activity Tolerance Patient tolerated treatment well    Behavior During Therapy WFL for tasks assessed/performed;Flat affect                 Past Medical History:  Diagnosis Date   Hypertriglyceridemia    Hypothyroidism    Morbid obesity (HCC)    Vitamin D deficiency    No past surgical history on file. 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/18/2015   Prediabetes 12/18/2015   Morbid obesity (HCC) 12/18/2015   Goiter 12/18/2015   Dyspepsia 12/18/2015   Elevated transaminase level 12/18/2015   Subclinical hypothyroidism 08/23/2015   Vitamin D deficiency 08/23/2015   Elevated hemoglobin A1c 08/23/2015   Family history of diabetic complications 08/23/2015    PCP: Levonne Lapping NP   REFERRING PROVIDER: Levonne Lapping, NP   REFERRING DIAG: M54.50 (ICD-10-CM) - Low back pain   Rationale for Evaluation and Treatment: Rehabilitation  THERAPY DIAG:  Other low back pain  Muscle weakness (generalized)  ONSET DATE: 05/02/2022   SUBJECTIVE:                                                                                                                                                                                           SUBJECTIVE STATEMENT: Pt reports his pain has been worse this week. Has not been to the gym since last visit  and has not been doing his HEP. Rating pain as a 7/10 but stating that it "does not bother him that much".   PERTINENT HISTORY:   PAIN:  Are you having pain? Yes: NPRS scale: 7/10 Pain location: middle of back  Pain description: pulling Aggravating factors: laying down then getting back up Relieving factors: nothing    PRECAUTIONS: None  WEIGHT BEARING RESTRICTIONS: No  FALLS:  Has patient fallen in last 6 months? No  LIVING ENVIRONMENT: Lives with: lives with their family Lives in: House/apartment Stairs: 3 steps  on the inside,2 steps no rails  Has following equipment at home: None  OCCUPATION: student   PLOF: Independent, Independent with basic ADLs, Independent with gait, and Independent with transfers  PATIENT GOALS: see if I can get feeling a little better    OBJECTIVE:   DIAGNOSTIC FINDINGS:  CLINICAL DATA:  Low back pain radiating to left buttock, no injury   EXAM: LUMBAR SPINE - 2-3 VIEW   COMPARISON:  08/01/2018   FINDINGS: There is no evidence of lumbar spine fracture. Alignment is normal. Disc spaces and vertebral body heights are preserved. Nonobstructive pattern of overlying bowel gas.   IMPRESSION: No fracture or dislocation of the lumbar spine. Disc spaces and vertebral body heights are preserved. Lumbar disc and neural foraminal pathology may be further evaluated by MRI if indicated by neurologically localizing signs and symptoms.  IMPRESSION: 1. No hydronephrosis. No sonographic evidence of pyelonephritis but ultrasound is not sensitive for pyelonephritis    PATIENT SURVEYS:  Oswestry 6/50  SCREENING FOR RED FLAGS: Bowel or bladder incontinence: Yes: started 2 months ago   but later clarifies this is not true incontinence  Spinal tumors: No Cauda equina syndrome: Yes: started 2 months ago  Compression fracture: No Abdominal aneurysm: No  MUSCLE LENGTH:  B hamstrings with severe limitation, B piriformis with severe limitation, B  quads moderate limitation   POSTURE: rounded shoulders, forward head, increased thoracic kyphosis, and flexed trunk   PALPATION:  R lumbar paraspinals TTP, L glutes/piriformis TTP; lumbar PAs WNL down to L4 then very hypomobile at L5     TODAY'S TREATMENT:       Ther Act  Discussed importance of doing HEP in order to get benefit from PT, as pt has not been completing at home during entirety of therapy and continues to report high pain levels. Emphasized how therapy cannot help if pt is not compliant. 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 Therapist unable to find imaging in pt's chart, pt reports it was different medical group. Encouraged pt to request updated MRI if he feels as though therapy is not helping his pain. Pt verbalized understanding.   Ther Ex Elliptical level 1 for 8 minutes (4 min fwd and 4 min retro) for cardiovascular warmup, BLE strength and hamstring activation. Pt tolerated well and rated pain as 6/10  Seated hamstring stretch, x60s per side RDLs to 8" box target using 20# KB, x10 reps, for improved hamstring and glute strength. Min multimodal cues for proper form throughout  Jimmye Norman carries using 25# KB, x115' each side, for improved core stability and global strength. Pt reported increased discomfort while holding KB in LUE > RUE.  Seated OH presses w/10# med ball for improved core stability and postural control, x15 reps. Progressed to isometric OH ball hold w/alt marches, x5 reps per side. Pt unable to maintain ball OH, so swapped for 5# Dbs, x8 per side.  Pt rated pain in low back as 6/10 following session.   PATIENT EDUCATION:  Education details: continue HEP  Person educated: Patient Education method: Explanation, Demonstration, Tactile cues, and Verbal cues Education comprehension: verbalized understanding and returned demonstration  HOME EXERCISE  PROGRAM: Access Code: [email or web address] URL: [email or web address] Date: 07/08/2022 Prepared by: Alethia Berthold Zachory Mangual  Exercises - Supine Lower Trunk Rotation  - 2 x daily - 7 x weekly - 1 sets - 5 reps - 10 hold - Supine Single Knee to Chest  Stretch  - 2 x daily - 7 x weekly - 1 sets - 5 reps - 10 hold - Supine Figure 4 Piriformis Stretch  - 2 x daily - 7 x weekly - 1 sets - 3 reps - 30 hold - Single Leg Bridge  - 2 x daily - 7 x weekly - 1 sets - 10 reps - 2 hold - Seated Hamstring Stretch  - 2 x daily - 7 x weekly - 1 sets - 3 reps - Seated Quadratus Lumborum Stretch in Chair  - 2 x daily - 7 x weekly - 1 sets - 3 reps - 30 hold - Calf stretch  - 1 x daily - 7 x weekly - 3 reps - 30second hold  You Can Walk For A Certain Length Of Time Each Day                          Walk 10 minutes 1 times per day.             Increase 5  minutes every 14 days              Work up to 20-30 minutes (1-2 times per day).               Example:                         Day 1-2           4-5 minutes     3 times per day                         Day 7-8           10-12 minutes 2-3 times per day                         Day 13-14       20-22 minutes 1-2 times per day  ASSESSMENT:  CLINICAL IMPRESSION: Emphasis of skilled PT session on BLE strength, endurance and core stability. Pt continues to have low back pain, specifically first thing in the AM. Therapist unable to assess if PT is helpful as pt has been unable to perform HEP and demonstrates minor changes in pain during session. Encouraged pt to reach out to PCP to request MRI if he feels as though PT is not helpful and given his history of bulging discs. Continue POC.    OBJECTIVE IMPAIRMENTS: decreased knowledge of condition, decreased mobility, difficulty walking, decreased ROM, decreased strength, hypomobility, increased fascial restrictions, increased muscle spasms, impaired flexibility, postural dysfunction, and pain.   ACTIVITY LIMITATIONS:  carrying, lifting, bending, sitting, standing, squatting, sleeping, reach over head, and locomotion level  PARTICIPATION LIMITATIONS: cleaning, laundry, driving, shopping, community activity, occupation, and yard work  PERSONAL FACTORS: Age, Behavior pattern, Education, Fitness, and Past/current experiences are also affecting patient's functional outcome.   REHAB POTENTIAL: Good  CLINICAL DECISION MAKING: Stable/uncomplicated  EVALUATION COMPLEXITY: Low  OLD GOALS: Goals reviewed with patient? No  SHORT TERM GOALS: Target date: 05/29/2022  Will be compliant with appropriate progressive HEP  Baseline: Goal status: IN PROGRESS 11/21- improving 3x/week now   2.  Pain to be no more than 6/10 at worst  Baseline:  Goal status: IN PROGRESS 11/21- 8/10 at worst   3.  Will demonstrate better understanding of general posture and biomechanics  Baseline:  Goal status: IN PROGRESS  4.  Lumbar ROM to be no more than 25% limited on all planes  Baseline:  Goal status: IN PROGRESS severe limitation in flexion and rotation   5.  Hamstring and piriformis mm flexibility limitations to be no more than 25% impaired  Baseline:  Goal status: IN PROGRESS 11/21- still very tight   LONG TERM GOALS: Target date: 06/19/2022  MMT to improve by at least 1 grade in all weak groups  Baseline:   MMT Right (12/12) Left (12/12)  Hip flexion 4+ 4  Hip extension 4-/5 4  Hip abduction 4+ 4  Hip adduction      Hip internal rotation      Hip external rotation      Knee flexion 5 5  Knee extension 5 5  Ankle dorsiflexion 5 5  Ankle plantarflexion      Ankle inversion      Ankle eversion       (Blank rows = not tested) Goal status: MET  2.  Pain to be no more than 4/10 at worst  Baseline: 8/10 at worst over last week (12/12) Goal status: NOT MET  3.  Will demonstrate good biomechanics for weighted squats and deadlifts to prevent exacerbating condition with gym activities  Baseline: Reviewed  both squats and deadlifts with good mechanics noted. Goal status: MET  4.  Will be able to sit/stand/walk for unlimited periods of time without increase in pain to allow him to participate in all school and work based activities without increase in pain  Baseline:  Goal status: INITIAL  NEW GOALS: Goals reviewed with patient? YES  SHORT TERM GOALS: Target date: 07/11/2021  Pt will be independent with strength and stretching HEP to promote pain management and safe return to gym. Baseline:  Established, needs advancement Goal status: INITIAL  2.  Pain to be no more than 6/10 at worst over previous 7 days in order to demonstrate improving quality of life and pain management.  Baseline: 8/10 (12/12) Goal status: INITIAL  LONG TERM GOALS: Target date: 08/01/2021  Pt to be compliant to walking program x3 days per week to promote general aerobic and lumbar vascular health.  Baseline: Established 06/17/2022 10 mins per day Goal status: INITIAL  2.  Pain to be no more than 4/10 at worst over last 7 days to demonstrate improved pain management and quality of life. Baseline: 8/10 at worst over last week (12/12) Goal status: INITIAL  3.  Pt will ambulate >/=2000' feet independently w/o increase pain of 2 or more intervals to demonstrate improved activity tolerance and pain management.  Baseline: Generalized inc pain w/ activity Goal status:  INITIAL  4.  Pt will perform weighted carry over level and unlevel surfaces and up and down stairs without increased pain and demonstrating good carrying mechanics in order to improve access to community environment and improved safety with high level physical activity.  Baseline: Weighted carry to be assessed. Goal status: INITIAL  PLAN:  PT FREQUENCY: 2x/week  PT DURATION: 6 weeks (only schedule for 4 weeks)  PLANNED INTERVENTIONS: Therapeutic exercises, Therapeutic activity, Neuromuscular re-education, Gait training, Patient/Family education, Self  Care, Dry Needling, Manual therapy, and Re-evaluation.  PLAN FOR NEXT SESSION: Note about POC: scheduled for 2x/wk, but if not approved may need to cancel visits and call pt to inform!, work on lumbar and hip ROM/mobility, core strength, lifting/carrying biomechanics, manual/DN PRN, farmer's carries, goodmornings, wobble bar? Work on lumbar extension    Data processing manager,  PT, [place] [address] [place], [place]  [address] Phone:  [phone] Fax:  [phone] 07/15/2022, 2:04 PM   Check all possible CPT codes: [phone] - PT Re-evaluation, 97110- Therapeutic Exercise, [phone]- Neuro Re-education, [phone] - Gait Training, [phone] - Manual Therapy, [phone] - Therapeutic Activities, and [phone] - Self Care    Check all conditions that are expected to impact treatment: Musculoskeletal disorders and Social determinants of health   If treatment provided at initial evaluation, no treatment charged due to lack of authorization.

## 2022-07-17 ENCOUNTER — Ambulatory Visit: Payer: Medicaid Other | Admitting: Physical Therapy

## 2022-07-17 ENCOUNTER — Encounter: Payer: Self-pay | Admitting: Physical Therapy

## 2022-07-17 DIAGNOSIS — M5459 Other low back pain: Secondary | ICD-10-CM | POA: Diagnosis not present

## 2022-07-17 DIAGNOSIS — R293 Abnormal posture: Secondary | ICD-10-CM

## 2022-07-17 DIAGNOSIS — R29898 Other symptoms and signs involving the musculoskeletal system: Secondary | ICD-10-CM

## 2022-07-17 DIAGNOSIS — M6281 Muscle weakness (generalized): Secondary | ICD-10-CM

## 2022-07-17 NOTE — Therapy (Signed)
OUTPATIENT PHYSICAL THERAPY THORACOLUMBAR TREATMENT   Patient Name: Gary Maynard MRN: [id number] DOB:01/03/2001, 22 y.o., male Today's Date: 07/17/2022   PT End of Session - 07/17/22 1503     Visit Number 8    Number of Visits 21   13+8   Date for PT Re-Evaluation 01/60/10   re-cert completed on 06/17/2022   Authorization Type healthy [employer]    Authorization Time Period 05/08/22 to 07/03/22    PT Start Time 1500   pt late   PT Stop Time 1531    PT Time Calculation (min) 31 min    Activity Tolerance Patient tolerated treatment well    Behavior During Therapy [facility] for tasks assessed/performed;Flat affect                 Past Medical History:  Diagnosis Date   Hypertriglyceridemia    Hypothyroidism    Morbid obesity (HCC)    Vitamin D deficiency    History reviewed. No pertinent surgical history. 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/18/2015   Prediabetes 12/18/2015   Morbid obesity (HCC) 12/18/2015   Goiter 12/18/2015   Dyspepsia 12/18/2015   Elevated transaminase level 12/18/2015   Subclinical hypothyroidism 08/23/2015   Vitamin D deficiency 08/23/2015   Elevated hemoglobin A1c 08/23/2015   Family history of diabetic complications 08/23/2015    PCP: Levonne Lapping NP   REFERRING PROVIDER: Levonne Lapping, NP   REFERRING DIAG: M54.50 (ICD-10-CM) - Low back pain   Rationale for Evaluation and Treatment: Rehabilitation  THERAPY DIAG:  Other low back pain  Muscle weakness (generalized)  Other symptoms and signs involving the musculoskeletal system  Abnormal posture  ONSET DATE: 05/02/2022   SUBJECTIVE:                                                                                                                                                                                           SUBJECTIVE STATEMENT: Pt states he has been doing his HEP more  lately.  He states he had a lot of pain this morning, but it has calmed down.  PERTINENT HISTORY:   PAIN:  Are you having pain? Yes: NPRS scale: 6/10 Pain location: middle of back  Pain description: pulling Aggravating factors: laying down then getting back up Relieving factors: nothing    PRECAUTIONS: None  WEIGHT BEARING RESTRICTIONS: No  FALLS:  Has patient fallen in last 6 months? No  LIVING ENVIRONMENT: Lives with: lives with their family Lives in: House/apartment Stairs: 3 steps on the inside,2 steps no rails  Has following equipment at home: None  OCCUPATION: student   PLOF: Independent, Independent with basic ADLs, Independent with gait, and Independent with transfers  PATIENT GOALS: see if I can get feeling a little better    OBJECTIVE:   DIAGNOSTIC FINDINGS:  CLINICAL DATA:  Low back pain radiating to left buttock, no injury   EXAM: LUMBAR SPINE - 2-3 VIEW   COMPARISON:  08/01/2018   FINDINGS: There is no evidence of lumbar spine fracture. Alignment is normal. Disc spaces and vertebral body heights are preserved. Nonobstructive pattern of overlying bowel gas.   IMPRESSION: No fracture or dislocation of the lumbar spine. Disc spaces and vertebral body heights are preserved. Lumbar disc and neural foraminal pathology may be further evaluated by MRI if indicated by neurologically localizing signs and symptoms.  IMPRESSION: 1. No hydronephrosis. No sonographic evidence of pyelonephritis but ultrasound is not sensitive for pyelonephritis    PATIENT SURVEYS:  Oswestry 6/50  SCREENING FOR RED FLAGS: Bowel or bladder incontinence: Yes: started 2 months ago   but later clarifies this is not true incontinence  Spinal tumors: No Cauda equina syndrome: Yes: started 2 months ago  Compression fracture: No Abdominal aneurysm: No  MUSCLE LENGTH:  B hamstrings with severe limitation, B piriformis with severe limitation, B quads moderate limitation    POSTURE: rounded shoulders, forward head, increased thoracic kyphosis, and flexed trunk   PALPATION:  R lumbar paraspinals TTP, L glutes/piriformis TTP; lumbar PAs WNL down to L4 then very hypomobile at L5     TODAY'S TREATMENT:        Ther Ex Seated on green physioball marches w/ 3 sec hold x10 each LE, significant sway w/ use of UE to stabilize frequently, cued for core engagement Seated on green physioball w/ 2lb bar overhead lift > 7 lbs, 2x15 Seated physioball pole press 10x3sec each for core engagement Short lever arm side stepping w/ black band x10 each side Pallof press black band x12 each side Added last two exercises to HEP.  PATIENT EDUCATION:  Education details: Continue HEP.  Encouraged to follow-up with MD about MRI, pt states he plans to do this tomorrow (07/18/2022).  Discussed general principles of muscular recovery per pt inquiry as he has been sore from gym.  Modifications to HEP. Person educated: Patient Education method: Explanation, Demonstration, Tactile cues, and Verbal cues Education comprehension: verbalized understanding and returned demonstration  HOME EXERCISE PROGRAM: Access Code: [email or web address] URL: [email or web address] Date: 07/08/2022 Prepared by: Mickie Bail Plaster  Exercises - Supine Lower Trunk Rotation  - 2 x daily - 7 x weekly - 1 sets - 5 reps - 10 hold - Supine Single Knee to Chest Stretch  - 2 x daily - 7 x weekly - 1 sets - 5 reps - 10 hold - Supine Figure 4 Piriformis Stretch  - 2 x daily - 7 x weekly - 1 sets - 3 reps - 30 hold - Single Leg Bridge  - 2 x daily - 7 x weekly - 1 sets - 10 reps - 2 hold - Seated Hamstring Stretch  - 2 x daily - 7 x weekly - 1 sets - 3 reps - Seated Quadratus Lumborum Stretch in Chair  - 2 x daily - 7 x weekly - 1 sets - 3 reps - 30 hold - Calf stretch  - 1 x daily - 7 x weekly - 3 reps - 30second hold - Standing Anti-Rotation Press with Anchored Resistance  - 1 x daily - 7 x weekly - 3 sets - 10  reps -  Anti-Rotation Sidestepping with Resistance  - 1 x daily - 7 x weekly - 3 sets - 10 reps  You Can Walk For A Certain Length Of Time Each Day                          Walk 10 minutes 1 times per day.             Increase 5  minutes every 14 days              Work up to 20-30 minutes (1-2 times per day).               Example:                         Day 1-2           4-5 minutes     3 times per day                         Day 7-8           10-12 minutes 2-3 times per day                         Day 13-14       20-22 minutes 1-2 times per day  ASSESSMENT:  CLINICAL IMPRESSION: Session limited by patient being late today.  Focus of skilled session on lower level core engagement tasks to promote core stability and support low back with modifications made to HEP as pt reports improved compliance since last visit.  He continues to benefit from skilled PT to promote health literacy and safety with exercise form in addition to pain management.   OBJECTIVE IMPAIRMENTS: decreased knowledge of condition, decreased mobility, difficulty walking, decreased ROM, decreased strength, hypomobility, increased fascial restrictions, increased muscle spasms, impaired flexibility, postural dysfunction, and pain.   ACTIVITY LIMITATIONS: carrying, lifting, bending, sitting, standing, squatting, sleeping, reach over head, and locomotion level  PARTICIPATION LIMITATIONS: cleaning, laundry, driving, shopping, community activity, occupation, and yard work  PERSONAL FACTORS: Age, Behavior pattern, Education, Fitness, and Past/current experiences are also affecting patient's functional outcome.   REHAB POTENTIAL: Good  CLINICAL DECISION MAKING: Stable/uncomplicated  EVALUATION COMPLEXITY: Low  NEW GOALS: Goals reviewed with patient? YES  SHORT TERM GOALS: Target date: 07/11/2021  Pt will be independent with strength and stretching HEP to promote pain management and safe return to gym. Baseline:  Pt is  improving compliance (07/17/2022) Goal status: IN PROGRESS  2.  Pain to be no more than 6/10 at worst over previous 7 days in order to demonstrate improving quality of life and pain management.  Baseline: 8/10 (12/12); 9/10 at worst (07/17/2022) Goal status: NOT MET  LONG TERM GOALS: Target date: 08/01/2021  Pt to be compliant to walking program x3 days per week to promote general aerobic and lumbar vascular health.  Baseline: Established 06/17/2022 10 mins per day Goal status: INITIAL  2.  Pain to be no more than 4/10 at worst over last 7 days to demonstrate improved pain management and quality of life. Baseline: 8/10 at worst over last week (12/12) Goal status: INITIAL  3.  Pt will ambulate >/=2000' feet independently w/o increase pain of 2 or more intervals to demonstrate improved activity tolerance and pain management.  Baseline: Generalized inc pain w/ activity Goal status:  INITIAL  4.  Pt  will perform weighted carry over level and unlevel surfaces and up and down stairs without increased pain and demonstrating good carrying mechanics in order to improve access to community environment and improved safety with high level physical activity.  Baseline: Weighted carry to be assessed. Goal status: INITIAL  PLAN:  PT FREQUENCY: 2x/week  PT DURATION: 6 weeks (only schedule for 4 weeks)  PLANNED INTERVENTIONS: Therapeutic exercises, Therapeutic activity, Neuromuscular re-education, Gait training, Patient/Family education, Self Care, Dry Needling, Manual therapy, and Re-evaluation.  PLAN FOR NEXT SESSION: Note about POC: scheduled for 2x/wk, but if not approved may need to cancel visits and call pt to inform!, work on lumbar and hip ROM/mobility, core strength, lifting/carrying biomechanics, manual/DN PRN, farmer's carries, goodmornings, wobble bar? Work on lumbar extension  Elease Etienne, PT, [facility] [address] [place], [place]   [address] Phone:  [phone] Fax:  [phone] 07/17/2022, 5:15 PM   Check all possible CPT codes: [phone] - PT Re-evaluation, 97110- Therapeutic Exercise, [phone]- Neuro Re-education, [phone] - Gait Training, [phone] - Manual Therapy, [phone] - Therapeutic Activities, and [phone] - Self Care    Check all conditions that are expected to impact treatment: Musculoskeletal disorders and Social determinants of health   If treatment provided at initial evaluation, no treatment charged due to lack of authorization.

## 2022-07-17 NOTE — Patient Instructions (Signed)
-   Standing Anti-Rotation Press with Anchored Resistance  - 1 x daily - 7 x weekly - 3 sets - 10 reps - Anti-Rotation Sidestepping with Resistance  - 1 x daily - 7 x weekly - 3 sets - 10 reps

## 2022-07-22 ENCOUNTER — Ambulatory Visit: Payer: Medicaid Other | Admitting: Physical Therapy

## 2022-07-22 DIAGNOSIS — M5459 Other low back pain: Secondary | ICD-10-CM

## 2022-07-22 DIAGNOSIS — M6281 Muscle weakness (generalized): Secondary | ICD-10-CM

## 2022-07-22 NOTE — Therapy (Signed)
OUTPATIENT PHYSICAL THERAPY THORACOLUMBAR TREATMENT   Patient Name: Gary Maynard MRN: [id number] DOB:[birth year], 22 y.o., male Today's Date: 07/22/2022   PT End of Session - 07/22/22 1320     Visit Number 9    Number of Visits 21   13+8   Date for PT Re-Evaluation 25/42/70   re-cert completed on 06/17/2022   Authorization Type healthy blue    Authorization Time Period 05/08/22 to 07/03/22    PT Start Time 1318    PT Stop Time 1400    PT Time Calculation (min) 42 min    Activity Tolerance Patient tolerated treatment well    Behavior During Therapy [facility] for tasks assessed/performed;Flat affect                  Past Medical History:  Diagnosis Date   Hypertriglyceridemia    Hypothyroidism    Morbid obesity (HCC)    Vitamin D deficiency    No past surgical history on file. 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/18/2015   Prediabetes 12/18/2015   Morbid obesity (HCC) 12/18/2015   Goiter 12/18/2015   Dyspepsia 12/18/2015   Elevated transaminase level 12/18/2015   Subclinical hypothyroidism 08/23/2015   Vitamin D deficiency 08/23/2015   Elevated hemoglobin A1c 08/23/2015   Family history of diabetic complications 08/23/2015    PCP: Levonne Lapping NP   REFERRING PROVIDER: Levonne Lapping, NP   REFERRING DIAG: M54.50 (ICD-10-CM) - Low back pain   Rationale for Evaluation and Treatment: Rehabilitation  THERAPY DIAG:  Muscle weakness (generalized)  Other low back pain  ONSET DATE: 05/02/2022   SUBJECTIVE:                                                                                                                                                                                           SUBJECTIVE STATEMENT: Pt reports he has appointment w/PCP scheduled for next week. Not having as much pain in the morning now. HEP is going well   PERTINENT HISTORY:    PAIN:  Are you having pain? No: NPRS scale: 0/10 Pain location:  Pain description:  Aggravating factors:  Relieving factors:    PRECAUTIONS: None  WEIGHT BEARING RESTRICTIONS: No  FALLS:  Has patient fallen in last 6 months? No  LIVING ENVIRONMENT: Lives with: lives with their family Lives in: House/apartment Stairs: 3 steps on the inside,2 steps no rails  Has following equipment at home: None  OCCUPATION: student   PLOF: Independent, Independent with basic ADLs, Independent with gait, and Independent with transfers  PATIENT GOALS: see if I can  get feeling a little better    OBJECTIVE:   DIAGNOSTIC FINDINGS:  CLINICAL DATA:  Low back pain radiating to left buttock, no injury   EXAM: LUMBAR SPINE - 2-3 VIEW   COMPARISON:  08/01/2018   FINDINGS: There is no evidence of lumbar spine fracture. Alignment is normal. Disc spaces and vertebral body heights are preserved. Nonobstructive pattern of overlying bowel gas.   IMPRESSION: No fracture or dislocation of the lumbar spine. Disc spaces and vertebral body heights are preserved. Lumbar disc and neural foraminal pathology may be further evaluated by MRI if indicated by neurologically localizing signs and symptoms.  IMPRESSION: 1. No hydronephrosis. No sonographic evidence of pyelonephritis but ultrasound is not sensitive for pyelonephritis    PATIENT SURVEYS:  Oswestry 6/50  SCREENING FOR RED FLAGS: Bowel or bladder incontinence: Yes: started 2 months ago   but later clarifies this is not true incontinence  Spinal tumors: No Cauda equina syndrome: Yes: started 2 months ago  Compression fracture: No Abdominal aneurysm: No  MUSCLE LENGTH:  B hamstrings with severe limitation, B piriformis with severe limitation, B quads moderate limitation   POSTURE: rounded shoulders, forward head, increased thoracic kyphosis, and flexed trunk   PALPATION:  R lumbar paraspinals TTP, L glutes/piriformis TTP; lumbar  PAs WNL down to L4 then very hypomobile at L5     TODAY'S TREATMENT:       Ther Ex  Standing toe sweeps, x10 per side, for improved hamstring mobility.  Standing goodmornings using PVC, x15 reps, for improved posterior chain ROM. Pt requires max multimodal cues to perform properly  Double leg presses using 100#, x10 reps. Min cues to avoid knee hyperextension and for slow eccentric. Progressed to single leg presses, x8 per side at 100#, for isolated quad strength. Noted increased difficulty performing on LLE > RLE Single leg glute bridges using 15# KB, x8 per side, for improved posterior chain strength. Noted increased difficulty on LLE > RLE.  Prone press ups on elbows, x3 minutes, for improved lumbar extension ROM. Pt reported discomfort in low back but not pain  Elliptical level 2 for 4 minutes in posterior direction for improved BLE strength and cardiovascular endurance.   PATIENT EDUCATION:  Education details: Continue HEP.  Encouraged pt to call clinic or tell PT on Thursday what time his PCP appointment is on 1/25 to avoid conflict w/scheduled therapy appointment  Person educated: Patient Education method: Explanation, Demonstration, Tactile cues, and Verbal cues Education comprehension: verbalized understanding and returned demonstration  HOME EXERCISE PROGRAM: Access Code: [email or web address] URL: [email or web address] Date: 07/08/2022 Prepared by: Alethia Berthold Lorn Butcher  Exercises - Supine Lower Trunk Rotation  - 2 x daily - 7 x weekly - 1 sets - 5 reps - 10 hold - Supine Single Knee to Chest Stretch  - 2 x daily - 7 x weekly - 1 sets - 5 reps - 10 hold - Supine Figure 4 Piriformis Stretch  - 2 x daily - 7 x weekly - 1 sets - 3 reps - 30 hold - Single Leg Bridge  - 2 x daily - 7 x weekly - 1 sets - 10 reps - 2 hold - Seated Hamstring Stretch  - 2 x daily - 7 x weekly - 1 sets - 3 reps - Seated Quadratus Lumborum Stretch in Chair  - 2 x daily - 7 x weekly - 1 sets - 3 reps - 30  hold - Calf stretch  - 1 x daily - 7 x weekly - 3 reps -  30second hold - Standing Anti-Rotation Press with Anchored Resistance  - 1 x daily - 7 x weekly - 3 sets - 10 reps - Anti-Rotation Sidestepping with Resistance  - 1 x daily - 7 x weekly - 3 sets - 10 reps  You Can Walk For A Certain Length Of Time Each Day                          Walk 10 minutes 1 times per day.             Increase 5  minutes every 14 days              Work up to 20-30 minutes (1-2 times per day).               Example:                         Day 1-2           4-5 minutes     3 times per day                         Day 7-8           10-12 minutes 2-3 times per day                         Day 13-14       20-22 minutes 1-2 times per day  ASSESSMENT:  CLINICAL IMPRESSION: Emphasis of skilled PT session on hamstring and posterior chain mobility, BLE strength and endurance. Pt tolerated session well w/no increase in pain. Noted increased muscle twitching in LLE w/exertion and pt reports increased weakness on L side. Pt continues to have hypomobility in bilateral hamstrings and lumbar extension but does improve w/dynamic stretching and strength exercises. Continue POC.    OBJECTIVE IMPAIRMENTS: decreased knowledge of condition, decreased mobility, difficulty walking, decreased ROM, decreased strength, hypomobility, increased fascial restrictions, increased muscle spasms, impaired flexibility, postural dysfunction, and pain.   ACTIVITY LIMITATIONS: carrying, lifting, bending, sitting, standing, squatting, sleeping, reach over head, and locomotion level  PARTICIPATION LIMITATIONS: cleaning, laundry, driving, shopping, community activity, occupation, and yard work  PERSONAL FACTORS: Age, Behavior pattern, Education, Fitness, and Past/current experiences are also affecting patient's functional outcome.   REHAB POTENTIAL: Good  CLINICAL DECISION MAKING: Stable/uncomplicated  EVALUATION COMPLEXITY: Low  NEW  GOALS: Goals reviewed with patient? YES  SHORT TERM GOALS: Target date: 07/11/2021  Pt will be independent with strength and stretching HEP to promote pain management and safe return to gym. Baseline:  Pt is improving compliance (07/17/2022) Goal status: IN PROGRESS  2.  Pain to be no more than 6/10 at worst over previous 7 days in order to demonstrate improving quality of life and pain management.  Baseline: 8/10 (12/12); 9/10 at worst (07/17/2022) Goal status: NOT MET  LONG TERM GOALS: Target date: 08/01/2021  Pt to be compliant to walking program x3 days per week to promote general aerobic and lumbar vascular health.  Baseline: Established 06/17/2022 10 mins per day Goal status: INITIAL  2.  Pain to be no more than 4/10 at worst over last 7 days to demonstrate improved pain management and quality of life. Baseline: 8/10 at worst over last week (12/12) Goal status: INITIAL  3.  Pt will ambulate >/=2000' feet independently w/o increase pain of 2 or more  intervals to demonstrate improved activity tolerance and pain management.  Baseline: Generalized inc pain w/ activity Goal status:  INITIAL  4.  Pt will perform weighted carry over level and unlevel surfaces and up and down stairs without increased pain and demonstrating good carrying mechanics in order to improve access to community environment and improved safety with high level physical activity.  Baseline: Weighted carry to be assessed. Goal status: INITIAL  PLAN:  PT FREQUENCY: 2x/week  PT DURATION: 6 weeks (only schedule for 4 weeks)  PLANNED INTERVENTIONS: Therapeutic exercises, Therapeutic activity, Neuromuscular re-education, Gait training, Patient/Family education, Self Care, Dry Needling, Manual therapy, and Re-evaluation.  PLAN FOR NEXT SESSION: Note about POC: scheduled for 2x/wk, but if not approved may need to cancel visits and call pt to inform!, work on lumbar and hip ROM/mobility, core strength, lifting/carrying  biomechanics, manual/DN PRN, farmer's carries, goodmornings, wobble bar? Work on lumbar extension  Charlett Nose, PT, [place] [address] [place], [place]  [address] Phone:  [phone] Fax:  [phone] 07/22/2022, 2:01 PM   Check all possible CPT codes: [phone] - PT Re-evaluation, 97110- Therapeutic Exercise, [phone]- Neuro Re-education, [phone] - Gait Training, [phone] - Manual Therapy, [phone] - Therapeutic Activities, and [phone] - [place] all conditions that are expected to impact treatment: Musculoskeletal disorders and Social determinants of health   If treatment provided at initial evaluation, no treatment charged due to lack of authorization.

## 2022-07-24 ENCOUNTER — Ambulatory Visit: Payer: Medicaid Other | Admitting: Physical Therapy

## 2022-07-24 ENCOUNTER — Encounter: Payer: Self-pay | Admitting: Physical Therapy

## 2022-07-24 DIAGNOSIS — R293 Abnormal posture: Secondary | ICD-10-CM

## 2022-07-24 DIAGNOSIS — M5459 Other low back pain: Secondary | ICD-10-CM | POA: Diagnosis not present

## 2022-07-24 DIAGNOSIS — R29898 Other symptoms and signs involving the musculoskeletal system: Secondary | ICD-10-CM

## 2022-07-24 DIAGNOSIS — M6281 Muscle weakness (generalized): Secondary | ICD-10-CM

## 2022-07-24 NOTE — Therapy (Signed)
OUTPATIENT PHYSICAL THERAPY THORACOLUMBAR TREATMENT   Patient Name: Gary Maynard MRN: [id number] DOB:2001/02/21, 22 y.o., male Today's Date: 07/24/2022   PT End of Session - 07/24/22 1405     Visit Number 10    Number of Visits 21   13+8   Date for PT Re-Evaluation 0000000   re-cert completed on 123456   Authorization Type healthy blue    Authorization Time Period 05/08/22 to 07/03/22    PT Start Time 1402    PT Stop Time 1440    PT Time Calculation (min) 38 min    Activity Tolerance Patient tolerated treatment well    Behavior During Therapy [facility] for tasks assessed/performed;Flat affect                  Past Medical History:  Diagnosis Date   Hypertriglyceridemia    Hypothyroidism    Morbid obesity ([place])    Vitamin D deficiency    History reviewed. No pertinent surgical history. 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/18/2015   Prediabetes 12/18/2015   Morbid obesity ([place]) 12/18/2015   Goiter 12/18/2015   Dyspepsia 12/18/2015   Elevated transaminase level 12/18/2015   Subclinical hypothyroidism 08/23/2015   Vitamin D deficiency 08/23/2015   Elevated hemoglobin A1c 08/23/2015   Family history of diabetic complications [id number]    PCP: Dianna Rossetti NP   REFERRING PROVIDER: Dianna Rossetti, NP   REFERRING DIAG: M54.50 (ICD-10-CM) - Low back pain   Rationale for Evaluation and Treatment: Rehabilitation  THERAPY DIAG:  Muscle weakness (generalized)  Other low back pain  Other symptoms and signs involving the musculoskeletal system  Abnormal posture  ONSET DATE: 05/02/2022   SUBJECTIVE:                                                                                                                                                                                           SUBJECTIVE STATEMENT: Pt reports he has appointment w/PCP scheduled  for Thursday of next week.  He states he is doing HEP and that pallof press is easy now.    PERTINENT HISTORY:   PAIN:  Are you having pain? YES: NPRS scale: 5/10 Pain location: right lower back Pain description: numb, pinching Aggravating factors: unsure Relieving factors: nothing that he notices  PRECAUTIONS: None  WEIGHT BEARING RESTRICTIONS: No  FALLS:  Has patient fallen in last 6 months? No  LIVING ENVIRONMENT: Lives with: lives with their family Lives in: House/apartment Stairs: 3 steps on the inside,2 steps no rails  Has following equipment at home: None  OCCUPATION: student  PLOF: Independent, Independent with basic ADLs, Independent with gait, and Independent with transfers  PATIENT GOALS: see if I can get feeling a little better    OBJECTIVE:   DIAGNOSTIC FINDINGS:  CLINICAL DATA:  Low back pain radiating to left buttock, no injury   EXAM: LUMBAR SPINE - 2-3 VIEW   COMPARISON:  08/01/2018   FINDINGS: There is no evidence of lumbar spine fracture. Alignment is normal. Disc spaces and vertebral body heights are preserved. Nonobstructive pattern of overlying bowel gas.   IMPRESSION: No fracture or dislocation of the lumbar spine. Disc spaces and vertebral body heights are preserved. Lumbar disc and neural foraminal pathology may be further evaluated by MRI if indicated by neurologically localizing signs and symptoms.  IMPRESSION: 1. No hydronephrosis. No sonographic evidence of pyelonephritis but ultrasound is not sensitive for pyelonephritis    PATIENT SURVEYS:  Oswestry 6/50  SCREENING FOR RED FLAGS: Bowel or bladder incontinence: Yes: started 2 months ago   but later clarifies this is not true incontinence  Spinal tumors: No Cauda equina syndrome: Yes: started 2 months ago  Compression fracture: No Abdominal aneurysm: No  MUSCLE LENGTH:  B hamstrings with severe limitation, B piriformis with severe limitation, B quads moderate  limitation   POSTURE: rounded shoulders, forward head, increased thoracic kyphosis, and flexed trunk   PALPATION:  R lumbar paraspinals TTP, L glutes/piriformis TTP; lumbar PAs WNL down to L4 then very hypomobile at L5     TODAY'S TREATMENT:       Ther Ex  Seating windmills x10 each side in alternating pattern, cued for large amplitude to increase back extension and flexion Side-lying open books for thoracic segmental mobility x10 each side w/ therapist facilitating stacked pelvis for increased mobilization Posterior and anterior pelvic tilts x20 in supine, PT provides facilitation, demonstration, and cuing to engage TrA for low back mobility Prone straight leg hip extension 2x10 each LE, PT progresses from preventing pelvic rotation to pt stabilizing independently Thoracolumbar extension over chair back transitioning into forward folded chair 2x10 cued for paced breathing and increased amplitude of extension stretch each rep, pt reports feeling stretch at base of neck Side glides x10 each side, left produced relief, right produced pinching (reproduced pain described prior to session per pt report)  PATIENT EDUCATION:  Education details: Continue HEP.  Reminded about asking for MRI when seeing MD.  Encouraged pt to call clinic to ask what time his PCP appointment is on 1/61 to avoid conflict w/scheduled therapy appointment as he has not done so yet (he is waiting for the reminder text). Person educated: Patient Education method: Explanation, Demonstration, Tactile cues, and Verbal cues Education comprehension: verbalized understanding and returned demonstration  HOME EXERCISE PROGRAM: Access Code: [email or web address] URL: [email or web address] Date: 07/08/2022 Prepared by: Mickie Bail Plaster  Exercises - Supine Lower Trunk Rotation  - 2 x daily - 7 x weekly - 1 sets - 5 reps - 10 hold - Supine Single Knee to Chest Stretch  - 2 x daily - 7 x weekly - 1 sets - 5 reps - 10 hold - Supine  Figure 4 Piriformis Stretch  - 2 x daily - 7 x weekly - 1 sets - 3 reps - 30 hold - Single Leg Bridge  - 2 x daily - 7 x weekly - 1 sets - 10 reps - 2 hold - Seated Hamstring Stretch  - 2 x daily - 7 x weekly - 1 sets - 3 reps - Seated Quadratus Lumborum Stretch in Chair  -  2 x daily - 7 x weekly - 1 sets - 3 reps - 30 hold - Calf stretch  - 1 x daily - 7 x weekly - 3 reps - 30second hold - Standing Anti-Rotation Press with Anchored Resistance  - 1 x daily - 7 x weekly - 3 sets - 10 reps - Anti-Rotation Sidestepping with Resistance  - 1 x daily - 7 x weekly - 3 sets - 10 reps  You Can Walk For A Certain Length Of Time Each Day                          Walk 10 minutes 1 times per day.             Increase 5  minutes every 14 days              Work up to 20-30 minutes (1-2 times per day).               Example:                         Day 1-2           4-5 minutes     3 times per day                         Day 7-8           10-12 minutes 2-3 times per day                         Day 13-14       20-22 minutes 1-2 times per day  ASSESSMENT:  CLINICAL IMPRESSION: Focus of session today on continued attempts to address low back mobility, especially extension.  Pt remains limited by decreased lumbar lordosis and general abnormal posturing in upright positioning.  PT to continue addressing these deficits in attempt to manage pain and promote continued management in home environment.   OBJECTIVE IMPAIRMENTS: decreased knowledge of condition, decreased mobility, difficulty walking, decreased ROM, decreased strength, hypomobility, increased fascial restrictions, increased muscle spasms, impaired flexibility, postural dysfunction, and pain.   ACTIVITY LIMITATIONS: carrying, lifting, bending, sitting, standing, squatting, sleeping, reach over head, and locomotion level  PARTICIPATION LIMITATIONS: cleaning, laundry, driving, shopping, community activity, occupation, and yard work  PERSONAL FACTORS:  Age, Behavior pattern, Education, Fitness, and Past/current experiences are also affecting patient's functional outcome.   REHAB POTENTIAL: Good  CLINICAL DECISION MAKING: Stable/uncomplicated  EVALUATION COMPLEXITY: Low  NEW GOALS: Goals reviewed with patient? YES  SHORT TERM GOALS: Target date: 07/11/2021  Pt will be independent with strength and stretching HEP to promote pain management and safe return to gym. Baseline:  Pt is improving compliance (07/17/2022) Goal status: IN PROGRESS  2.  Pain to be no more than 6/10 at worst over previous 7 days in order to demonstrate improving quality of life and pain management.  Baseline: 8/10 (12/12); 9/10 at worst (07/17/2022) Goal status: NOT MET  LONG TERM GOALS: Target date: 08/01/2021  Pt to be compliant to walking program x3 days per week to promote general aerobic and lumbar vascular health.  Baseline: Established 06/17/2022 10 mins per day Goal status: INITIAL  2.  Pain to be no more than 4/10 at worst over last 7 days to demonstrate improved pain management and quality of life. Baseline: 8/10 at worst over last week (  12/12) Goal status: INITIAL  3.  Pt will ambulate >/=2000' feet independently w/o increase pain of 2 or more intervals to demonstrate improved activity tolerance and pain management.  Baseline: Generalized inc pain w/ activity Goal status:  INITIAL  4.  Pt will perform weighted carry over level and unlevel surfaces and up and down stairs without increased pain and demonstrating good carrying mechanics in order to improve access to community environment and improved safety with high level physical activity.  Baseline: Weighted carry to be assessed. Goal status: INITIAL  PLAN:  PT FREQUENCY: 2x/week  PT DURATION: 6 weeks (only schedule for 4 weeks)  PLANNED INTERVENTIONS: Therapeutic exercises, Therapeutic activity, Neuromuscular re-education, Gait training, Patient/Family education, Self Care, Dry Needling,  Manual therapy, and Re-evaluation.  PLAN FOR NEXT SESSION: Note about POC: scheduled for 2x/wk, but if not approved may need to cancel visits and call pt to inform!, work on lumbar and hip ROM/mobility, core strength, lifting/carrying biomechanics, manual/DN PRN, farmer's carries, goodmornings, wobble bar? Work on lumbar extension  Charlett Nose, PT, [place] [address] [place], [place]  [address] Phone:  [phone] Fax:  [phone] 07/24/2022, 2:41 PM   Check all possible CPT codes: [phone] - PT Re-evaluation, 97110- Therapeutic Exercise, [phone]- Neuro Re-education, [phone] - Gait Training, [phone] - Manual Therapy, [phone] - Therapeutic Activities, and [phone] - [facility] all conditions that are expected to impact treatment: Musculoskeletal disorders and Social determinants of health   If treatment provided at initial evaluation, no treatment charged due to lack of authorization.

## 2022-07-29 ENCOUNTER — Ambulatory Visit: Payer: Medicaid Other | Admitting: Physical Therapy

## 2022-07-29 DIAGNOSIS — M5459 Other low back pain: Secondary | ICD-10-CM

## 2022-07-29 DIAGNOSIS — M6281 Muscle weakness (generalized): Secondary | ICD-10-CM

## 2022-07-29 NOTE — Therapy (Signed)
OUTPATIENT PHYSICAL THERAPY THORACOLUMBAR TREATMENT   Patient Name: Gary Maynard MRN: [id number] DOB:12-Nov-2000, 22 y.o., male Today's Date: 07/29/2022   PT End of Session - 07/29/22 1319     Visit Number 11    Number of Visits 21   13+8   Date for PT Re-Evaluation 09/32/35   re-cert completed on 06/17/2022   Authorization Type healthy blue    Authorization Time Period 05/08/22 to 07/03/22    PT Start Time 1318    PT Stop Time 1356    PT Time Calculation (min) 38 min    Activity Tolerance Patient tolerated treatment well    Behavior During Therapy [facility] for tasks assessed/performed;Flat affect                   Past Medical History:  Diagnosis Date   Hypertriglyceridemia    Hypothyroidism    Morbid obesity (HCC)    Vitamin D deficiency    No past surgical history on file. 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/18/2015   Prediabetes 12/18/2015   Morbid obesity (HCC) 12/18/2015   Goiter 12/18/2015   Dyspepsia 12/18/2015   Elevated transaminase level 12/18/2015   Subclinical hypothyroidism 08/23/2015   Vitamin D deficiency 08/23/2015   Elevated hemoglobin A1c 08/23/2015   Family history of diabetic complications 08/23/2015    PCP: Levonne Lapping NP   REFERRING PROVIDER: Levonne Lapping, NP   REFERRING DIAG: M54.50 (ICD-10-CM) - Low back pain   Rationale for Evaluation and Treatment: Rehabilitation  THERAPY DIAG:  Muscle weakness (generalized)  Other low back pain  ONSET DATE: 05/02/2022   SUBJECTIVE:                                                                                                                                                                                           SUBJECTIVE STATEMENT: Pt reports he is sore today from lifting yesterday, did try the modifications to deadlifts that was discussed in previous session and they helped.  Has appointment w/PCP on 1/25 and will request MRI.   PERTINENT HISTORY:   PAIN:  Are you having pain? YES: NPRS scale: 5/10 Pain location: right lower back Pain description: numb, pinching Aggravating factors: unsure Relieving factors: nothing that he notices  PRECAUTIONS: None  WEIGHT BEARING RESTRICTIONS: No  FALLS:  Has patient fallen in last 6 months? No  LIVING ENVIRONMENT: Lives with: lives with their family Lives in: House/apartment Stairs: 3 steps on the inside,2 steps no rails  Has following equipment at home: None  OCCUPATION: student   PLOF: Independent, Independent with  basic ADLs, Independent with gait, and Independent with transfers  PATIENT GOALS: see if I can get feeling a little better    OBJECTIVE:   DIAGNOSTIC FINDINGS:  CLINICAL DATA:  Low back pain radiating to left buttock, no injury   EXAM: LUMBAR SPINE - 2-3 VIEW   COMPARISON:  08/01/2018   FINDINGS: There is no evidence of lumbar spine fracture. Alignment is normal. Disc spaces and vertebral body heights are preserved. Nonobstructive pattern of overlying bowel gas.   IMPRESSION: No fracture or dislocation of the lumbar spine. Disc spaces and vertebral body heights are preserved. Lumbar disc and neural foraminal pathology may be further evaluated by MRI if indicated by neurologically localizing signs and symptoms.  IMPRESSION: 1. No hydronephrosis. No sonographic evidence of pyelonephritis but ultrasound is not sensitive for pyelonephritis    SCREENING FOR RED FLAGS: Bowel or bladder incontinence: Yes: started 2 months ago   but later clarifies this is not true incontinence  Spinal tumors: No Cauda equina syndrome: Yes: started 2 months ago  Compression fracture: No Abdominal aneurysm: No  MUSCLE LENGTH:  B hamstrings with severe limitation, B piriformis with severe limitation, B quads moderate limitation   POSTURE: rounded shoulders, forward head, increased thoracic  kyphosis, and flexed trunk   PALPATION:  R lumbar paraspinals TTP, L glutes/piriformis TTP; lumbar PAs WNL down to L4 then very hypomobile at L5    TODAY'S TREATMENT:       Ther Act  Discussed POC moving forward - plan to pause PT until pt sees PCP and can get MRI in order to save visits and ensure no issues with back.    [facility] PT Assessment - 07/29/22 1336       Observation/Other Assessments   Other Surveys  Oswestry Disability Index    Oswestry Disability Index  17/50 (moderate disabilty)             Ther Ex  Modified plantigrade thread the needles for improved thoracic rotation, x15 per side.  Staggered stance single arm rows for improved thoracic rotation and scapular strength, x12 per side using 12# KB.   PATIENT EDUCATION:  Education details: Plan to pause PT to save visits and scheduled visit for one month from today, continue HEP  Person educated: Patient Education method: Explanation, Demonstration, Tactile cues, and Verbal cues Education comprehension: verbalized understanding and returned demonstration  HOME EXERCISE PROGRAM: Access Code: [email or web address] URL: [email or web address] Date: 07/08/2022 Prepared by: Mickie Bail Kamir Selover  Exercises - Supine Lower Trunk Rotation  - 2 x daily - 7 x weekly - 1 sets - 5 reps - 10 hold - Supine Single Knee to Chest Stretch  - 2 x daily - 7 x weekly - 1 sets - 5 reps - 10 hold - Supine Figure 4 Piriformis Stretch  - 2 x daily - 7 x weekly - 1 sets - 3 reps - 30 hold - Single Leg Bridge  - 2 x daily - 7 x weekly - 1 sets - 10 reps - 2 hold - Seated Hamstring Stretch  - 2 x daily - 7 x weekly - 1 sets - 3 reps - Seated Quadratus Lumborum Stretch in Chair  - 2 x daily - 7 x weekly - 1 sets - 3 reps - 30 hold - Calf stretch  - 1 x daily - 7 x weekly - 3 reps - 30second hold - Standing Anti-Rotation Press with Anchored Resistance  - 1 x daily - 7 x weekly - 3 sets - 10 reps -  Anti-Rotation Sidestepping with Resistance  - 1 x  daily - 7 x weekly - 3 sets - 10 reps  You Can Walk For A Certain Length Of Time Each Day                          Walk 10 minutes 1 times per day.             Increase 5  minutes every 14 days              Work up to 20-30 minutes (1-2 times per day).               Example:                         Day 1-2           4-5 minutes     3 times per day                         Day 7-8           10-12 minutes 2-3 times per day                         Day 13-14       20-22 minutes 1-2 times per day  ASSESSMENT:  CLINICAL IMPRESSION: Emphasis of skilled PT session on discussing POC moving forward and improved thoracic mobility. Pt in agreement to pause PT for one month to allow time to see PCP and obtain MRI of lumbar spine. Pt scored a 17/50 on Oswestry Low Back Questionnaire, increased from the 6/50 he score on eval, indicative of moderate disability. Pt continues to be limited by low back pain and hypomobility of thoracic and lumbar spine. Continue POC.   OBJECTIVE IMPAIRMENTS: decreased knowledge of condition, decreased mobility, difficulty walking, decreased ROM, decreased strength, hypomobility, increased fascial restrictions, increased muscle spasms, impaired flexibility, postural dysfunction, and pain.   ACTIVITY LIMITATIONS: carrying, lifting, bending, sitting, standing, squatting, sleeping, reach over head, and locomotion level  PARTICIPATION LIMITATIONS: cleaning, laundry, driving, shopping, community activity, occupation, and yard work  PERSONAL FACTORS: Age, Behavior pattern, Education, Fitness, and Past/current experiences are also affecting patient's functional outcome.   REHAB POTENTIAL: Good  CLINICAL DECISION MAKING: Stable/uncomplicated  EVALUATION COMPLEXITY: Low  NEW GOALS: Goals reviewed with patient? YES  LONG TERM GOALS: Target date: 08/01/2021  Pt to be compliant to walking program x3 days per week to promote general aerobic and lumbar vascular health.  Baseline:  Established 06/17/2022 10 mins per day Goal status: NOT MET   2.  Pain to be no more than 4/10 at worst over last 7 days to demonstrate improved pain management and quality of life. Baseline: 8/10 at worst over last week (12/12) Goal status: NOT MET   3.  Pt will ambulate >/=2000' feet independently w/o increase pain of 2 or more intervals to demonstrate improved activity tolerance and pain management.  Baseline: Generalized inc pain w/ activity Goal status:  MET  4.  Pt will perform weighted carry over level and unlevel surfaces and up and down stairs without increased pain and demonstrating good carrying mechanics in order to improve access to community environment and improved safety with high level physical activity.  Baseline: Weighted carry to be assessed. Goal status: MET   NEW LONG TERM GOALS FOR UPDATED POC:  Target date: 08/26/2022  Pt will be independent w/final HEP for improved functional mobility and strength  Baseline:  Goal status: INITIAL  2.  Oswestry to be performed at 30 day assessment to compare to baseline function  Baseline: 17/50 on 07/29/22 Goal status: INITIAL   PLAN:  PT FREQUENCY: 2x/week  PT DURATION: 6 weeks (only schedule for 4 weeks)  PLANNED INTERVENTIONS: Therapeutic exercises, Therapeutic activity, Neuromuscular re-education, Gait training, Patient/Family education, Self Care, Dry Needling, Manual therapy, and Re-evaluation.  PLAN FOR NEXT SESSION: Did he get MRI? Recert and check/add new goals. work on lumbar and hip ROM/mobility, core strength, lifting/carrying biomechanics, manual/DN PRN, farmer's carries, goodmornings, wobble bar? Work on lumbar extension  Josephine Igo, PT, DPT [facility] [address] [place], [place]  [address] Phone:  [phone] Fax:  [phone] 07/29/2022, 1:57 PM   Check all possible CPT codes: [phone] - PT Re-evaluation, 97110- Therapeutic Exercise, [phone]- Neuro Re-education, [phone] -  Gait Training, [phone] - Manual Therapy, [phone] - Therapeutic Activities, and [phone] - Self Care    Check all conditions that are expected to impact treatment: Musculoskeletal disorders and Social determinants of health   If treatment provided at initial evaluation, no treatment charged due to lack of authorization.

## 2022-07-31 ENCOUNTER — Other Ambulatory Visit (HOSPITAL_BASED_OUTPATIENT_CLINIC_OR_DEPARTMENT_OTHER): Payer: Self-pay | Admitting: Nurse Practitioner

## 2022-07-31 ENCOUNTER — Ambulatory Visit: Payer: Medicaid Other | Admitting: Physical Therapy

## 2022-07-31 DIAGNOSIS — M549 Dorsalgia, unspecified: Secondary | ICD-10-CM

## 2022-08-05 ENCOUNTER — Ambulatory Visit: Payer: Medicaid Other | Admitting: Physical Therapy

## 2022-08-09 ENCOUNTER — Ambulatory Visit (HOSPITAL_BASED_OUTPATIENT_CLINIC_OR_DEPARTMENT_OTHER): Payer: Medicaid Other

## 2022-08-16 ENCOUNTER — Ambulatory Visit (HOSPITAL_BASED_OUTPATIENT_CLINIC_OR_DEPARTMENT_OTHER): Payer: Medicaid Other

## 2022-08-26 ENCOUNTER — Ambulatory Visit: Payer: Medicaid Other | Attending: Nurse Practitioner | Admitting: Physical Therapy

## 2022-08-26 DIAGNOSIS — M5459 Other low back pain: Secondary | ICD-10-CM | POA: Insufficient documentation

## 2022-08-26 NOTE — Addendum Note (Signed)
Addended by: Charlett Nose on: 08/26/2022 01:45 PM   Modules accepted: Orders

## 2022-08-26 NOTE — Therapy (Addendum)
OUTPATIENT PHYSICAL THERAPY THORACOLUMBAR TREATMENT- RECERTIFICATION AND DISCHARGE SUMMARY   Patient Name: Gary Maynard MRN: [id number] DOB:01-26-2001, 22 y.o., male Today's Date: 08/26/2022  PHYSICAL THERAPY DISCHARGE SUMMARY  Visits from Start of Care: 12  Current functional level related to goals / functional outcomes: Independent with all ADLs   Remaining deficits: Chronic low back pain   Education / Equipment: HEP   Patient agrees to discharge. Patient goals were met. Patient is being discharged due to being pleased with the current functional level.    PT End of Session - 08/26/22 1323     Visit Number 12    Number of Visits 21   13+8   Date for PT Re-Evaluation 0000000   re-cert completed on [id number]   Authorization Type healthy blue    Authorization Time Period 05/08/22 to 07/03/22    PT Start Time 1318    PT Stop Time 1335   DC   PT Time Calculation (min) 17 min    Activity Tolerance Patient tolerated treatment well    Behavior During Therapy WFL for tasks assessed/performed;Flat affect                    Past Medical History:  Diagnosis Date   Hypertriglyceridemia    Hypothyroidism    Morbid obesity ([place])    Vitamin D deficiency    No past surgical history on file. 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/18/2015   Prediabetes 12/18/2015   Morbid obesity ([place]) 12/18/2015   Goiter 12/18/2015   Dyspepsia 12/18/2015   Elevated transaminase level 12/18/2015   Subclinical hypothyroidism 08/23/2015   Vitamin D deficiency 08/23/2015   Elevated hemoglobin A1c 08/23/2015   Family history of diabetic complications [id number]    PCP: Dianna Rossetti NP   REFERRING PROVIDER: Dianna Rossetti, NP   REFERRING DIAG: M54.50 (ICD-10-CM) - Low back pain   Rationale for Evaluation and Treatment: Rehabilitation  THERAPY DIAG:  Other low back  pain  ONSET DATE: 05/02/2022   SUBJECTIVE:                                                                                                                                                                                           SUBJECTIVE STATEMENT: Pt reports doing well, insurance did not approve MRI. Pt states he continues to have occasional back pain, especially in the morning, but his exercises have been helpful    PERTINENT HISTORY:   PAIN:  Are you having pain? YES: NPRS scale: 5/10 Pain location: right lower back Pain description: numb, pinching Aggravating factors:  unsure Relieving factors: nothing that he notices  PRECAUTIONS: None  WEIGHT BEARING RESTRICTIONS: No  FALLS:  Has patient fallen in last 6 months? No  LIVING ENVIRONMENT: Lives with: lives with their family Lives in: House/apartment Stairs: 3 steps on the inside,2 steps no rails  Has following equipment at home: None  OCCUPATION: student   PLOF: Independent, Independent with basic ADLs, Independent with gait, and Independent with transfers  PATIENT GOALS: see if I can get feeling a little better    OBJECTIVE:   DIAGNOSTIC FINDINGS:  CLINICAL DATA:  Low back pain radiating to left buttock, no injury   EXAM: LUMBAR SPINE - 2-3 VIEW   COMPARISON:  08/01/2018   FINDINGS: There is no evidence of lumbar spine fracture. Alignment is normal. Disc spaces and vertebral body heights are preserved. Nonobstructive pattern of overlying bowel gas.   IMPRESSION: No fracture or dislocation of the lumbar spine. Disc spaces and vertebral body heights are preserved. Lumbar disc and neural foraminal pathology may be further evaluated by MRI if indicated by neurologically localizing signs and symptoms.  IMPRESSION: 1. No hydronephrosis. No sonographic evidence of pyelonephritis but ultrasound is not sensitive for pyelonephritis    SCREENING FOR RED FLAGS: Bowel or bladder incontinence: Yes: started 2  months ago   but later clarifies this is not true incontinence  Spinal tumors: No Cauda equina syndrome: Yes: started 2 months ago  Compression fracture: No Abdominal aneurysm: No  MUSCLE LENGTH:  B hamstrings with severe limitation, B piriformis with severe limitation, B quads moderate limitation   POSTURE: rounded shoulders, forward head, increased thoracic kyphosis, and flexed trunk   PALPATION:  R lumbar paraspinals TTP, L glutes/piriformis TTP; lumbar PAs WNL down to L4 then very hypomobile at L5    TODAY'S TREATMENT:       Ther Act  Performed Oswestry Low Back Questionnaire: 18/50 (moderate disability) - unchanged from 07/29/22 Discussed plan to DC today as pt's low back pain has been tolerable w/implementation of stretches/exercises from HEP and weightlifting at gym. Pt in agreement to DC from PT today to conserve visits and due to satisfaction with current functional level.  Educated pt on how to obtain new PT referral if pain levels change, pt verbalized understanding.    PATIENT EDUCATION:  Education details: see above Person educated: Patient Education method: Explanation Education comprehension: verbalized understanding  HOME EXERCISE PROGRAM: Access Code: [email or web address] URL: [email or web address] Date: 07/08/2022 Prepared by: Mickie Bail Zarek Relph  Exercises - Supine Lower Trunk Rotation  - 2 x daily - 7 x weekly - 1 sets - 5 reps - 10 hold - Supine Single Knee to Chest Stretch  - 2 x daily - 7 x weekly - 1 sets - 5 reps - 10 hold - Supine Figure 4 Piriformis Stretch  - 2 x daily - 7 x weekly - 1 sets - 3 reps - 30 hold - Single Leg Bridge  - 2 x daily - 7 x weekly - 1 sets - 10 reps - 2 hold - Seated Hamstring Stretch  - 2 x daily - 7 x weekly - 1 sets - 3 reps - Seated Quadratus Lumborum Stretch in Chair  - 2 x daily - 7 x weekly - 1 sets - 3 reps - 30 hold - Calf stretch  - 1 x daily - 7 x weekly - 3 reps - 30second hold - Standing Anti-Rotation Press with  Anchored Resistance  - 1 x daily - 7 x weekly - 3 sets - 10  reps - Anti-Rotation Sidestepping with Resistance  - 1 x daily - 7 x weekly - 3 sets - 10 reps  You Can Walk For A Certain Length Of Time Each Day                          Walk 10 minutes 1 times per day.             Increase 5  minutes every 14 days              Work up to 20-30 minutes (1-2 times per day).               Example:                         Day 1-2           4-5 minutes     3 times per day                         Day 7-8           10-12 minutes 2-3 times per day                         Day 13-14       20-22 minutes 1-2 times per day  ASSESSMENT:  CLINICAL IMPRESSION: Emphasis of skilled PT session on DC from PT. At this time, pt has been unable to obtain MRI due to insurance denial but reports his back pain has been manageable w/HEP. Pt has met 2/2 LTGs, performing his HEP regularly and scoring the same on Oswestry Low Back Questionnaire compared to one month ago. Pt verbalized agreement to DC from PT to conserve therapy visits for remainder of year and due to satisfaction with current pain level.   OBJECTIVE IMPAIRMENTS: decreased knowledge of condition, decreased mobility, difficulty walking, decreased ROM, decreased strength, hypomobility, increased fascial restrictions, increased muscle spasms, impaired flexibility, postural dysfunction, and pain.   ACTIVITY LIMITATIONS: carrying, lifting, bending, sitting, standing, squatting, sleeping, reach over head, and locomotion level  PARTICIPATION LIMITATIONS: cleaning, laundry, driving, shopping, community activity, occupation, and yard work  PERSONAL FACTORS: Age, Behavior pattern, Education, Fitness, and Past/current experiences are also affecting patient's functional outcome.   REHAB POTENTIAL: Good  CLINICAL DECISION MAKING: Stable/uncomplicated  EVALUATION COMPLEXITY: Low  NEW GOALS: Goals reviewed with patient? YES  NEW LONG TERM GOALS FOR UPDATED POC:   Target date: 08/26/2022  Pt will be independent w/final HEP for improved functional mobility and strength  Baseline:  Goal status: MET  2.  Oswestry to be performed at 30 day assessment to compare to baseline function  Baseline: 17/50 on 07/29/22; 18/30  Goal status: MET   PLAN:  PT FREQUENCY: 2x/week  PT DURATION: 6 weeks (only schedule for 4 weeks)  PLANNED INTERVENTIONS: Therapeutic exercises, Therapeutic activity, Neuromuscular re-education, Gait training, Patient/Family education, Self Care, Dry Needling, Manual therapy, and Re-evaluation.   Charlett Nose, PT, [place] [address] [place], [place]  [address] Phone:  [phone] Fax:  [phone] 08/26/2022, 1:36 PM   Check all possible CPT codes: [phone] - PT Re-evaluation, 97110- Therapeutic Exercise, [phone]- Neuro Re-education, [phone] - Gait Training, [phone] - Manual Therapy, 97530 - Therapeutic Activities, and 97535 - Self Care    Check all conditions that are expected to impact treatment: Musculoskeletal disorders and  Social determinants of health   If treatment provided at initial evaluation, no treatment charged due to lack of authorization.

## 2022-09-26 ENCOUNTER — Telehealth (HOSPITAL_BASED_OUTPATIENT_CLINIC_OR_DEPARTMENT_OTHER): Payer: Self-pay

## 2022-09-27 ENCOUNTER — Ambulatory Visit (HOSPITAL_BASED_OUTPATIENT_CLINIC_OR_DEPARTMENT_OTHER): Payer: Medicaid Other

## 2022-10-02 ENCOUNTER — Ambulatory Visit (INDEPENDENT_AMBULATORY_CARE_PROVIDER_SITE_OTHER): Payer: Medicaid Other | Admitting: Orthopedic Surgery

## 2022-10-02 ENCOUNTER — Other Ambulatory Visit (INDEPENDENT_AMBULATORY_CARE_PROVIDER_SITE_OTHER): Payer: Medicaid Other

## 2022-10-02 ENCOUNTER — Encounter: Payer: Self-pay | Admitting: Orthopedic Surgery

## 2022-10-02 VITALS — BP 126/82 | HR 69 | Ht 73.0 in | Wt 265.0 lb

## 2022-10-02 DIAGNOSIS — M545 Low back pain, unspecified: Secondary | ICD-10-CM | POA: Diagnosis not present

## 2022-10-02 NOTE — Progress Notes (Signed)
Orthopedic Spine Surgery Office Note  Assessment: Patient is a 22 y.o. male with chronic low back pain   Plan: -Explained that initially conservative treatment is tried as a significant number of patients may experience relief with these treatment modalities. Discussed that the conservative treatments include:  -activity modification  -physical therapy  -over the counter pain medications  -medrol dosepak  -steroid injections -Patient has tried PT, tylenol, NSAIDs  -Recommended weight loss and continued core strengthening -Patient has tried over 6 weeks of physical therapy under the guidance of Sherrilyn Rist, NP and home exercise program without any significant relief.  He has had the pain for 4 to 5 years now.  Accordingly, recommended MRI of the lumbar spine -Patient should return to office in 5 weeks, x-rays at next visit: None   Patient expressed understanding of the plan and all questions were answered to the patient's satisfaction.   ___________________________________________________________________________   History:  Patient is a 22 y.o. male who presents today for lumbar spine.  Patient has had 4 to 5 years of low back pain.  He does not member any specific trauma or injury that brought on the pain.  Pain is felt in the midline of his lower lumbar spine.  It periodically radiates into the immediately adjacent paraspinal muscles.  He has no pain radiating into the buttock or either lower extremity.  He has done physical therapy from 04/25/2022 to 08/26/2022 without any relief.  He has also been taking Tylenol and NSAIDs during that timeframe.  Denies paresthesia numbness.   Weakness: Denies Symptoms of imbalance: Yes, sometimes when his back pain is severe he feels off balance.  No consistent symptoms of imbalance Paresthesias and numbness: Denies Bowel or bladder incontinence: Denies Saddle anesthesia: Denies  Treatments tried: PT, tylenol, NSAIDs   Review of systems: Denies  fevers and chills, night sweats, unexplained weight loss, history of cancer, pain that wakes them at night  Past medical history: Hypothyroidism  Allergies: NKDA  Past surgical history:  None  Social history: Denies use of nicotine product (smoking, vaping, patches, smokeless) Alcohol use: Yes, 4 drinks per week Reports marijuana use, denies other recreational drug use   Physical Exam:  General: no acute distress, appears stated age Neurologic: alert, answering questions appropriately, following commands Respiratory: unlabored breathing on room air, symmetric chest rise Psychiatric: appropriate affect, normal cadence to speech   MSK (spine):  -Strength exam      Left  Right EHL    5/5  5/5 TA    5/5  5/5 GSC    5/5  5/5 Knee extension  5/5  5/5 Hip flexion   5/5  5/5  -Sensory exam    Sensation intact to light touch in L3-S1 nerve distributions of bilateral lower extremities  -Achilles DTR: 1/4 on the left, 1/4 on the right -Patellar tendon DTR: 1/4 on the left, 1/4 on the right  -Straight leg raise: Negative bilaterally -Femoral nerve stretch test: Negative bilaterally -Clonus: no beats bilaterally  -Left hip exam: No pain through range of motion, negative Stinchfield, negative FABER, negative SI joint compression test -Right hip exam: No pain through range of motion, negative Stinchfield, negative FABER, negative SI joint compression test  Imaging: XRs of the lumbar spine from 10/02/2022 was independently reviewed and interpreted, showing disc height loss at L5/S1.  No other significant degenerative changes.  No fracture or dislocation.  No evidence of instability on flexion/extension views.  Patient name: Gary Maynard Patient MRN: [id number] Date of visit: 10/02/22

## 2022-10-25 ENCOUNTER — Ambulatory Visit
Admission: RE | Admit: 2022-10-25 | Discharge: 2022-10-25 | Disposition: A | Discharge status: Home / Self Care | Payer: Medicaid Other | Source: Ambulatory Visit | Attending: Orthopedic Surgery | Admitting: Orthopedic Surgery

## 2022-10-25 DIAGNOSIS — M545 Low back pain, unspecified: Secondary | ICD-10-CM

## 2022-11-06 ENCOUNTER — Ambulatory Visit: Payer: Medicaid Other | Admitting: Orthopedic Surgery

## 2022-12-09 ENCOUNTER — Other Ambulatory Visit: Payer: Self-pay | Admitting: Nurse Practitioner

## 2022-12-09 DIAGNOSIS — E039 Hypothyroidism, unspecified: Secondary | ICD-10-CM

## 2022-12-31 ENCOUNTER — Other Ambulatory Visit: Payer: Medicaid Other

## 2022-12-31 ENCOUNTER — Ambulatory Visit
Admission: RE | Admit: 2022-12-31 | Discharge: 2022-12-31 | Disposition: A | Discharge status: Home / Self Care | Payer: Medicaid Other | Source: Ambulatory Visit | Attending: Nurse Practitioner | Admitting: Nurse Practitioner

## 2022-12-31 DIAGNOSIS — E039 Hypothyroidism, unspecified: Secondary | ICD-10-CM

## 2023-06-02 ENCOUNTER — Other Ambulatory Visit: Payer: Self-pay | Admitting: Registered Nurse

## 2023-06-02 DIAGNOSIS — E063 Autoimmune thyroiditis: Secondary | ICD-10-CM

## 2023-06-09 ENCOUNTER — Ambulatory Visit
Admission: RE | Admit: 2023-06-09 | Discharge: 2023-06-09 | Disposition: A | Discharge status: Home / Self Care | Payer: Medicaid Other | Source: Ambulatory Visit | Attending: Registered Nurse | Admitting: Registered Nurse

## 2023-06-09 DIAGNOSIS — E063 Autoimmune thyroiditis: Secondary | ICD-10-CM

## 2023-06-26 ENCOUNTER — Encounter (HOSPITAL_COMMUNITY): Payer: Self-pay | Admitting: *Deleted

## 2023-06-26 ENCOUNTER — Emergency Department (HOSPITAL_COMMUNITY)
Admission: EM | Admit: 2023-06-26 | Discharge: 2023-06-26 | Disposition: A | Discharge status: Home / Self Care | Payer: Medicaid Other | Attending: Emergency Medicine | Admitting: Emergency Medicine

## 2023-06-26 ENCOUNTER — Other Ambulatory Visit: Payer: Self-pay

## 2023-06-26 DIAGNOSIS — G4486 Cervicogenic headache: Secondary | ICD-10-CM | POA: Diagnosis not present

## 2023-06-26 DIAGNOSIS — R4689 Other symptoms and signs involving appearance and behavior: Secondary | ICD-10-CM

## 2023-06-26 DIAGNOSIS — R519 Headache, unspecified: Secondary | ICD-10-CM | POA: Diagnosis present

## 2023-06-26 DIAGNOSIS — R456 Violent behavior: Secondary | ICD-10-CM | POA: Insufficient documentation

## 2023-06-26 LAB — CBC
HCT: 44.5 % (ref 39.0–52.0)
Hemoglobin: 14.7 g/dL (ref 13.0–17.0)
MCH: 28.5 pg (ref 26.0–34.0)
MCHC: 33 g/dL (ref 30.0–36.0)
MCV: 86.4 fL (ref 80.0–100.0)
Platelets: 466 10*3/uL — ABNORMAL HIGH (ref 150–400)
RBC: 5.15 MIL/uL (ref 4.22–5.81)
RDW: 14.6 % (ref 11.5–15.5)
WBC: 16.8 10*3/uL — ABNORMAL HIGH (ref 4.0–10.5)
nRBC: 0 % (ref 0.0–0.2)

## 2023-06-26 LAB — BASIC METABOLIC PANEL
Anion gap: 13 (ref 5–15)
BUN: 14 mg/dL (ref 6–20)
CO2: 23 mmol/L (ref 22–32)
Calcium: 9.9 mg/dL (ref 8.9–10.3)
Chloride: 106 mmol/L (ref 98–111)
Creatinine, Ser: 1.13 mg/dL (ref 0.61–1.24)
GFR, Estimated: >60 mL/min (ref >60)
Glucose, Bld: 123 mg/dL — ABNORMAL HIGH (ref 70–99)
Potassium: 4 mmol/L (ref 3.5–5.1)
Sodium: 142 mmol/L (ref 135–145)

## 2023-06-26 LAB — RAPID URINE DRUG SCREEN, HOSP PERFORMED
Amphetamines: NOT DETECTED
Barbiturates: NOT DETECTED
Benzodiazepines: NOT DETECTED
Cocaine: NOT DETECTED
Opiates: NOT DETECTED
Tetrahydrocannabinol: NOT DETECTED

## 2023-06-26 LAB — T4, FREE: Free T4: 1.11 ng/dL (ref 0.61–1.12)

## 2023-06-26 LAB — TSH: TSH: 4.686 u[IU]/mL — ABNORMAL HIGH (ref 0.350–4.500)

## 2023-06-26 NOTE — Discharge Instructions (Signed)
If you are worried that someone is getting a something that you do not want to take that and try not to except anything from them that you would ingest.  Please follow-up with your family doctor in the office.  You can discuss your concern for possible mold exposure and see what they recommend.

## 2023-06-26 NOTE — ED Provider Notes (Signed)
[place] EMERGENCY DEPARTMENT AT [facility] Provider Note   CSN: [id number] Arrival date & time: 06/26/23  1834     History  Chief Complaint  Patient presents with   feels like he was drugged    Gary Maynard is a 22 y.o. male.  22 yo M with a chief complaints of concern that someone is poisoning him.  He tells me that someone he works with has been giving him things that he thinks is making him feel unwell.  He tells me that it is made him more aggressive and also has caused him to have left-sided headaches.  He denies any new medications denies any new supplements.  He denied any illegal drug use.        Home Medications Prior to Admission medications   Medication Sig Start Date End Date Taking? Authorizing Provider  atorvastatin (LIPITOR) 10 MG tablet Take 10 mg by mouth daily. 05/17/23  Yes [provider]  cetirizine (ZYRTEC) 10 MG tablet Take 10 mg by mouth daily. 06/24/23  Yes [provider]  levothyroxine (SYNTHROID) 112 MCG tablet Take 112 mcg by mouth daily. 04/03/23  Yes [provider]  Vitamin D, Ergocalciferol, (DRISDOL) 1.25 MG (50000 UNIT) CAPS capsule Take 50,000 Units by mouth once a week. 06/02/23  Yes [provider]  cyclobenzaprine (FLEXERIL) 5 MG tablet Take 1 tablet by mouth 3 (three) times daily as needed for muscle spasms.    [provider]  naproxen (NAPROSYN) 500 MG tablet Take 1 tablet by mouth 2 (two) times daily with a meal. 04/15/19   [provider]  SUMAtriptan (IMITREX) 50 MG tablet Take 50 mg by mouth every 2 (two) hours as needed for migraine.    [provider]      Allergies    Patient has no known allergies.    Review of Systems   Review of Systems  Physical Exam Updated Vital Signs BP (!) 131/93 (BP Location: Right Arm)   Pulse (!) 103   Temp 98.5 F (36.9 C) (Oral)   Resp 18   Ht 6\' 1"  (1.854 m)   Wt 120.2 kg   SpO2 100%   BMI 34.96  kg/m  Physical Exam Vitals and nursing note reviewed.  Constitutional:      Appearance: He is well-developed.  HENT:     Head: Normocephalic and atraumatic.  Eyes:     Pupils: Pupils are equal, round, and reactive to light.  Neck:     Vascular: No JVD.  Cardiovascular:     Rate and Rhythm: Normal rate and regular rhythm.     Heart sounds: No murmur heard.    No friction rub. No gallop.  Pulmonary:     Effort: No respiratory distress.     Breath sounds: No wheezing.  Abdominal:     General: There is no distension.     Tenderness: There is no abdominal tenderness. There is no guarding or rebound.  Musculoskeletal:        General: Normal range of motion.     Cervical back: Normal range of motion and neck supple.  Skin:    Coloration: Skin is not pale.     Findings: No rash.  Neurological:     Mental Status: He is alert and oriented to person, place, and time.     GCS: GCS eye subscore is 4. GCS verbal subscore is 5. GCS motor subscore is 6.     Cranial Nerves: Cranial nerves 2-12 are  intact.     Sensory: Sensation is intact.     Motor: Motor function is intact.     Coordination: Coordination is intact.     Comments: Benign neurologic exam.  Psychiatric:        Behavior: Behavior normal.     ED Results / Procedures / Treatments   Labs (all labs ordered are listed, but only abnormal results are displayed) Labs Reviewed  TSH - Abnormal; Notable for the following components:      Result Value   TSH 4.686 (*)    All other components within normal limits  CBC - Abnormal; Notable for the following components:   WBC 16.8 (*)    Platelets 466 (*)    All other components within normal limits  BASIC METABOLIC PANEL - Abnormal; Notable for the following components:   Glucose, Bld 123 (*)    All other components within normal limits  T4, FREE  RAPID URINE DRUG SCREEN, HOSP PERFORMED    EKG EKG Interpretation Date/Time:  Friday June 26 2023 19:20:57 EST Ventricular  Rate:  117 PR Interval:  114 QRS Duration:  106 QT Interval:  328 QTC Calculation: 457 R Axis:   135  Text Interpretation: Sinus tachycardia Right axis deviation Abnormal ECG Since last tracing rate faster Otherwise no significant change Confirmed by Melene Plan [phone]) on 06/26/2023 8:54:52 PM  Radiology No results found.  Procedures Procedures    Medications Ordered in ED Medications - No data to display  ED Course/ Medical Decision Making/ A&P                                 Medical Decision Making  22 yo M with a chief complaints of feeling like someone is poisoning him.  He tells me that someone that he works with he thinks maybe is giving him something that is making him feel unwell.  I discussed this with the provider that saw him upfront.  He admitted to her that he had been doing illegal drugs.  I think that could be the cause of a lot of his symptoms.  He is a mild leukocytosis.  His TSH has been chronically elevated per my review of prior labs.  His free T4 is normal.  No significant electrolyte abnormalities.  UDS here was negative.  The patient is complaining of a headache.  He has a benign neurologic exam.  I had a long discussion with the patient about following up with his family doctor in the office.  10:24 PM:  I have discussed the diagnosis/risks/treatment options with the patient.  Evaluation and diagnostic testing in the emergency department does not suggest an emergent condition requiring admission or immediate intervention beyond what has been performed at this time.  They will follow up with PCP. We also discussed returning to the ED immediately if new or worsening sx occur. We discussed the sx which are most concerning (e.g., sudden worsening pain, fever, inability to tolerate by mouth) that necessitate immediate return. Medications administered to the patient during their visit and any new prescriptions provided to the patient are listed below.  Medications  given during this visit Medications - No data to display   The patient appears reasonably screen and/or stabilized for discharge and I doubt any other medical condition or other [facility] requiring further screening, evaluation, or treatment in the ED at this time prior to discharge.  Final Clinical Impression(s) / ED Diagnoses Final diagnoses:  Cervicogenic headache  Aggression    Rx / DC Orders ED Discharge Orders     None         Melene Plan, DO 06/26/23 2224

## 2023-06-26 NOTE — ED Triage Notes (Signed)
The pt thinks someone is giving him drugs that are hurting him

## 2023-06-26 NOTE — ED Provider Triage Note (Signed)
Emergency Medicine Provider Triage Evaluation Note  Brayten Langa , a 22 y.o. male  was evaluated in triage.  Pt complains of multiple complaints. States that 4 days ago he smoked his friends marijuana vape pen and soon after he started feeling agitated, hyperactive, and lightheaded. States that he has been smoking his own marijuana since then but has not used the pen again. He is requesting a drug screen to see if the marijuana was laced. Also states that he feels like is going to pass out every time he walks. Notes he is on synthroid for hypothyroidism and has been taking it as prescribed, however he read that his symptoms can be caused by thyroid dysfunction and requests to have his thyroid function checked. Denies chest pain or shortness of breath. No history of similar symptoms previously  Review of Systems  Positive:  Negative:   Physical Exam  Ht 6\' 1"  (1.854 m)   Wt 120.2 kg   BMI 34.96 kg/m  Gen:   Awake, no distress  Tearful in triage Resp:  Normal effort  MSK:   Moves extremities without difficulty  Other:    Medical Decision Making  Medically screening exam initiated at 7:06 PM.  Appropriate orders placed.  Kayler Hernandez-Escamilla was informed that the remainder of the evaluation will be completed by another provider, this initial triage assessment does not replace that evaluation, and the importance of remaining in the ED until their evaluation is complete.     Vear Clock 06/26/23 1910

## 2023-12-24 ENCOUNTER — Ambulatory Visit: Admitting: "Endocrinology
